# Patient Record
Sex: Female | Born: 1995 | Race: White | Hispanic: No | Marital: Single | Smoking: Current every day smoker
Health system: Southern US, Community
[De-identification: ages and names within clinical notes are randomized; demographics above are authoritative.]

## PROBLEM LIST (undated history)

## (undated) DIAGNOSIS — Z789 Other specified health status: Secondary | ICD-10-CM

## (undated) DIAGNOSIS — J40 Bronchitis, not specified as acute or chronic: Secondary | ICD-10-CM

## (undated) HISTORY — PX: NO PAST SURGERIES: SHX2092

---

## 2016-05-23 ENCOUNTER — Emergency Department (HOSPITAL_BASED_OUTPATIENT_CLINIC_OR_DEPARTMENT_OTHER)
Admission: EM | Admit: 2016-05-23 | Discharge: 2016-05-23 | Disposition: A | Discharge status: Home / Self Care | Payer: Medicaid Other | Attending: Emergency Medicine | Admitting: Emergency Medicine

## 2016-05-23 ENCOUNTER — Encounter (HOSPITAL_BASED_OUTPATIENT_CLINIC_OR_DEPARTMENT_OTHER): Payer: Self-pay | Admitting: Emergency Medicine

## 2016-05-23 DIAGNOSIS — Z202 Contact with and (suspected) exposure to infections with a predominantly sexual mode of transmission: Secondary | ICD-10-CM | POA: Diagnosis present

## 2016-05-23 DIAGNOSIS — F17200 Nicotine dependence, unspecified, uncomplicated: Secondary | ICD-10-CM | POA: Diagnosis not present

## 2016-05-23 DIAGNOSIS — R102 Pelvic and perineal pain: Secondary | ICD-10-CM | POA: Insufficient documentation

## 2016-05-23 DIAGNOSIS — N898 Other specified noninflammatory disorders of vagina: Secondary | ICD-10-CM | POA: Insufficient documentation

## 2016-05-23 HISTORY — DX: Bronchitis, not specified as acute or chronic: J40

## 2016-05-23 LAB — URINALYSIS, ROUTINE W REFLEX MICROSCOPIC
Bilirubin Urine: NEGATIVE
GLUCOSE, UA: NEGATIVE mg/dL
Hgb urine dipstick: NEGATIVE
Ketones, ur: NEGATIVE mg/dL
NITRITE: NEGATIVE
PH: 8.5 — AB (ref 5.0–8.0)
Protein, ur: NEGATIVE mg/dL
SPECIFIC GRAVITY, URINE: 1.011 (ref 1.005–1.030)

## 2016-05-23 LAB — URINALYSIS, MICROSCOPIC (REFLEX): RBC / HPF: NONE SEEN RBC/hpf (ref 0–5)

## 2016-05-23 LAB — WET PREP, GENITAL
CLUE CELLS WET PREP: NONE SEEN
SPERM: NONE SEEN
TRICH WET PREP: NONE SEEN
WBC, Wet Prep HPF POC: NONE SEEN
Yeast Wet Prep HPF POC: NONE SEEN

## 2016-05-23 LAB — PREGNANCY, URINE: Preg Test, Ur: NEGATIVE

## 2016-05-23 MED ORDER — CEFTRIAXONE SODIUM 250 MG IJ SOLR
250.0000 mg | Freq: Once | INTRAMUSCULAR | Status: DC
  Filled 2016-05-23: qty 250

## 2016-05-23 MED ORDER — AZITHROMYCIN 250 MG PO TABS
1000.0000 mg | ORAL_TABLET | Freq: Once | ORAL | Status: DC
  Filled 2016-05-23: qty 4

## 2016-05-23 MED ORDER — LIDOCAINE HCL (PF) 1 % IJ SOLN
INTRAMUSCULAR | Status: AC
  Filled 2016-05-23: qty 5

## 2016-05-23 NOTE — ED Triage Notes (Signed)
STD check

## 2016-05-23 NOTE — Discharge Instructions (Signed)
Follow up with OBGYN for further follow up for your discharge and pelvic exam

## 2016-05-23 NOTE — ED Provider Notes (Signed)
[facility]   CSN: [id number] Arrival date & time: 05/23/16  1517     History   Chief Complaint Chief Complaint  Patient presents with  . STD check    HPI Susan Decker is a 21 y.o. female.  21 yo F with a cc of concern for STD.  Also thinks she miscarried about 3 months ago.  Having brownish discharge.  Having some vaginal pain. 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    The history is provided by the patient.  Illness  This is a new problem. The current episode started 2 days ago. The problem occurs constantly. The problem has not changed since onset.Pertinent negatives include no chest pain, no headaches and no shortness of breath. Nothing aggravates the symptoms. Nothing relieves the symptoms. She has tried nothing for the symptoms. The treatment provided no relief.    Past Medical History:  Diagnosis Date  . Bronchitis     There are no active problems to display for this patient.   History reviewed. No pertinent surgical history.  OB History    No data available       Home Medications    Prior to Admission medications   Not on File    Family History History reviewed. No pertinent family history.  Social History Social History  Substance Use Topics  . Smoking status: Current Every Day Smoker  . Smokeless tobacco: Never Used  . Alcohol use Yes     Allergies   Patient has no known allergies.   Review of Systems Review of Systems  Constitutional: Negative for chills and fever.  HENT: Negative for congestion and rhinorrhea.   Eyes: Negative for redness and visual disturbance.  Respiratory: Negative for shortness of breath and wheezing.   Cardiovascular: Negative for chest pain and palpitations.  Gastrointestinal: Negative for nausea and vomiting.  Genitourinary: Positive for vaginal pain. Negative for dysuria and urgency.  Musculoskeletal: Negative for arthralgias and myalgias.  Skin: Negative for  pallor and wound.  Neurological: Negative for dizziness and headaches.     Physical Exam Updated Vital Signs BP 113/78 (BP Location: Left Arm)   Pulse 74   Temp 98.2 F (36.8 C) (Oral)   Resp 17   Ht  (1.626 m)   Wt 158 lb (71.7 kg)   LMP 04/28/2016   SpO2 100%   BMI 27.12 kg/m   Physical Exam  Constitutional: She is oriented to person, place, and time. She appears well-developed and well-nourished. No distress.  HENT:  Head: Normocephalic and atraumatic.  Eyes: EOM are normal. Pupils are equal, round, and reactive to light.  Neck: Normal range of motion. Neck supple.  Cardiovascular: Normal rate and regular rhythm.  Exam reveals no gallop and no friction rub.   No murmur heard. Pulmonary/Chest: Effort normal. She has no wheezes. She has no rales.  Abdominal: Soft. She exhibits no distension and no mass. There is no tenderness. There is no guarding.  Genitourinary:  Genitourinary Comments: Unable to fully visualize due to patient comfort.  Not tolerating speculum or digital exam.    Musculoskeletal: She exhibits no edema or tenderness.  Neurological: She is alert and oriented to person, place, and time.  Skin: Skin is warm and dry. She is not diaphoretic.  Psychiatric: She has a normal mood and affect. Her behavior is normal.  Nursing note and vitals reviewed.    ED Treatments / Results  Labs (all labs ordered are listed, but only  abnormal results are displayed) Labs Reviewed  URINALYSIS, ROUTINE W REFLEX MICROSCOPIC - Abnormal; Notable for the following:       Result Value   pH 8.5 (*)    Leukocytes, UA SMALL (*)    All other components within normal limits  URINALYSIS, MICROSCOPIC (REFLEX) - Abnormal; Notable for the following:    Bacteria, UA RARE (*)    Squamous Epithelial / LPF 0-5 (*)    All other components within normal limits  WET PREP, GENITAL  PREGNANCY, URINE  RPR  HIV ANTIBODY (ROUTINE TESTING)  GC/CHLAMYDIA PROBE AMP ([place]) NOT AT [facility]     EKG  EKG Interpretation None       Radiology No results found.  Procedures Procedures (including critical care time)  Medications Ordered in ED Medications  cefTRIAXone (ROCEPHIN) injection 250 mg (250 mg Intramuscular Refused 05/23/16 1632)  azithromycin (ZITHROMAX) tablet 1,000 mg (1,000 mg Oral Refused 05/23/16 1632)  lidocaine (PF) (XYLOCAINE) 1 % injection (  Refused 05/23/16 1632)     Initial Impression / Assessment and Plan / ED Course  I have reviewed the triage vital signs and the nursing notes.  Pertinent labs & imaging results that were available during my care of the patient were reviewed by me and considered in my medical decision making (see chart for details).     21 yo F with a cc of concern for STD.  Unable to perform exam as patient newly had sex for the first time.  Blind swabs sent, will cover for 2 most common STDs.  Check upreg.   Labs unremarkable. Will the patient follow-up with OB/GYN for further evaluation.  5:20 PM:  I have discussed the diagnosis/risks/treatment options with the patient and believe the pt to be eligible for discharge home to follow-up with PCP. We also discussed returning to the ED immediately if new or worsening sx occur. We discussed the sx which are most concerning (e.g., sudden worsening pain, fever, inability to tolerate by mouth) that necessitate immediate return. Medications administered to the patient during their visit and any new prescriptions provided to the patient are listed below.  Medications given during this visit Medications  cefTRIAXone (ROCEPHIN) injection 250 mg (250 mg Intramuscular Refused 05/23/16 1632)  azithromycin (ZITHROMAX) tablet 1,000 mg (1,000 mg Oral Refused 05/23/16 1632)  lidocaine (PF) (XYLOCAINE) 1 % injection (  Refused 05/23/16 1632)     The patient appears reasonably screen and/or stabilized for discharge and I doubt any other medical condition or other [facility] requiring further screening,  evaluation, or treatment in the ED at this time prior to discharge.    Final Clinical Impressions(s) / ED Diagnoses   Final diagnoses:  Vaginal discharge    New Prescriptions New Prescriptions   No medications on file     Melene Plan, DO 05/23/16 1720

## 2016-05-23 NOTE — ED Triage Notes (Signed)
Pt also states she needs an [place], had a miscarriage and didn't see dr, urgent care told her to come and get [place]

## 2016-05-23 NOTE — ED Notes (Signed)
Pt given sprite for PO challenge

## 2016-05-23 NOTE — ED Notes (Signed)
Pt drank sprite and reports no nausea at this time.

## 2016-05-24 LAB — HIV ANTIBODY (ROUTINE TESTING W REFLEX): HIV SCREEN 4TH GENERATION: NONREACTIVE

## 2016-05-24 LAB — GC/CHLAMYDIA PROBE AMP (~~LOC~~) NOT AT ARMC
Chlamydia: NEGATIVE
Neisseria Gonorrhea: NEGATIVE

## 2016-05-24 LAB — RPR: RPR: NONREACTIVE

## 2016-05-25 ENCOUNTER — Telehealth (HOSPITAL_BASED_OUTPATIENT_CLINIC_OR_DEPARTMENT_OTHER): Payer: Self-pay | Admitting: *Deleted

## 2016-05-25 NOTE — Telephone Encounter (Signed)
Pt here with questions regarding lab testing from ED visit several days ago. Pt directed to Mychart and given # for Susan Decker [phone]) if she is unable to view them

## 2019-11-17 ENCOUNTER — Emergency Department (HOSPITAL_BASED_OUTPATIENT_CLINIC_OR_DEPARTMENT_OTHER)
Admission: EM | Admit: 2019-11-17 | Discharge: 2019-11-17 | Disposition: A | Discharge status: Home / Self Care | Payer: Self-pay | Attending: Emergency Medicine | Admitting: Emergency Medicine

## 2019-11-17 ENCOUNTER — Encounter (HOSPITAL_BASED_OUTPATIENT_CLINIC_OR_DEPARTMENT_OTHER): Payer: Self-pay | Admitting: *Deleted

## 2019-11-17 ENCOUNTER — Other Ambulatory Visit: Payer: Self-pay

## 2019-11-17 DIAGNOSIS — R109 Unspecified abdominal pain: Secondary | ICD-10-CM | POA: Insufficient documentation

## 2019-11-17 DIAGNOSIS — Z3A01 Less than 8 weeks gestation of pregnancy: Secondary | ICD-10-CM | POA: Insufficient documentation

## 2019-11-17 DIAGNOSIS — Z5321 Procedure and treatment not carried out due to patient leaving prior to being seen by health care provider: Secondary | ICD-10-CM | POA: Insufficient documentation

## 2019-11-17 DIAGNOSIS — O26851 Spotting complicating pregnancy, first trimester: Secondary | ICD-10-CM | POA: Insufficient documentation

## 2019-11-17 DIAGNOSIS — O26891 Other specified pregnancy related conditions, first trimester: Secondary | ICD-10-CM | POA: Insufficient documentation

## 2019-11-17 DIAGNOSIS — N939 Abnormal uterine and vaginal bleeding, unspecified: Secondary | ICD-10-CM | POA: Insufficient documentation

## 2019-11-17 LAB — URINALYSIS, ROUTINE W REFLEX MICROSCOPIC

## 2019-11-17 LAB — HCG, QUANTITATIVE, PREGNANCY: hCG, Beta Chain, Quant, S: 7 m[IU]/mL — ABNORMAL HIGH (ref <5)

## 2019-11-17 LAB — URINALYSIS, MICROSCOPIC (REFLEX): RBC / HPF: >50 RBC/hpf (ref 0–5)

## 2019-11-17 LAB — PREGNANCY, URINE: Preg Test, Ur: NEGATIVE

## 2019-11-17 MED ORDER — ACETAMINOPHEN 500 MG PO TABS
1000.0000 mg | ORAL_TABLET | Freq: Once | ORAL | Status: AC
  Administered 2019-11-17: 1000 mg via ORAL
  Filled 2019-11-17: qty 2

## 2019-11-17 NOTE — ED Triage Notes (Signed)
Vaginal bleeding and abdominal cramps this afternoon. She had a positive home pregnancy test. She is [redacted] weeks pregnant.

## 2019-12-07 ENCOUNTER — Ambulatory Visit (HOSPITAL_COMMUNITY): Admission: EM | Admit: 2019-12-07 | Discharge: 2019-12-07 | Discharge status: Against Medical Advice | Payer: Self-pay

## 2019-12-07 ENCOUNTER — Other Ambulatory Visit: Payer: Self-pay

## 2019-12-07 NOTE — ED Notes (Signed)
Patient called for registration with no answer

## 2019-12-16 ENCOUNTER — Ambulatory Visit
Admission: EM | Admit: 2019-12-16 | Discharge: 2019-12-16 | Disposition: A | Discharge status: Home / Self Care | Payer: 59 | Attending: Family Medicine | Admitting: Family Medicine

## 2019-12-16 ENCOUNTER — Encounter: Payer: Self-pay | Admitting: Emergency Medicine

## 2019-12-16 ENCOUNTER — Other Ambulatory Visit: Payer: Self-pay

## 2019-12-16 DIAGNOSIS — H7292 Unspecified perforation of tympanic membrane, left ear: Secondary | ICD-10-CM

## 2019-12-16 NOTE — ED Provider Notes (Signed)
[name]    CSN: [id number] Arrival date & time: 12/16/19  1015      History   Chief Complaint Chief Complaint  Patient presents with   Ear Fullness    HPI Susan Decker is a 24 y.o. female.   Patient complains of some left ear symptoms.  She first describes it as fullness and feeling like her ear is underwater.  Has some difficulty hearing as well as ear pain.  She tells me she suffered a blow to the side of the head and ear about 3 days ago and symptoms started then.  She denies any congestion or sore throat prior to the onset.  HPI  Past Medical History:  Diagnosis Date   Bronchitis     There are no problems to display for this patient.   History reviewed. No pertinent surgical history.  OB History    Gravida  1   Para      Term      Preterm      AB      Living        SAB      TAB      Ectopic      Multiple      Live Births               Home Medications    Prior to Admission medications   Not on File    Family History History reviewed. No pertinent family history.  Social History Social History   Tobacco Use   Smoking status: Former Smoker   Smokeless tobacco: Never Used  Substance Use Topics   Alcohol use: Yes   Drug use: Not Currently     Allergies   Patient has no known allergies.   Review of Systems Review of Systems  HENT: Positive for ear pain and hearing loss.   All other systems reviewed and are negative.    Physical Exam Triage Vital Signs ED Triage Vitals  Enc Vitals Group     BP 12/16/19 1028 118/75     Pulse Rate 12/16/19 1028 75     Resp 12/16/19 1028 12     Temp 12/16/19 1028 99.3 F (37.4 C)     Temp Source 12/16/19 1028 Oral     SpO2 12/16/19 1028 97 %     Weight --      Height --      Head Circumference --      Peak Flow --      Pain Score 12/16/19 1026 0     Pain Loc --      Pain Edu? --      Excl. in GC? --    No data found.  Updated Vital Signs BP 118/75  (BP Location: Right Arm)    Pulse 75    Temp 99.3 F (37.4 C) (Oral)    Resp 12    LMP 12/09/2019    SpO2 97%    Breastfeeding Unknown   Visual Acuity Right Eye Distance:   Left Eye Distance:   Bilateral Distance:    Right Eye Near:   Left Eye Near:    Bilateral Near:     Physical Exam Vitals and nursing note reviewed.  Constitutional:      Appearance: Normal appearance. She is normal weight.  HENT:     Head: Normocephalic.     Ears:     Comments: Left TM has a perforation with some bones in the middle ear  visible through the perforation. Cardiovascular:     Rate and Rhythm: Regular rhythm.  Pulmonary:     Effort: Pulmonary effort is normal.     Breath sounds: Normal breath sounds.  Neurological:     General: No focal deficit present.     Mental Status: She is alert and oriented to person, place, and time.      UC Treatments / Results  Labs (all labs ordered are listed, but only abnormal results are displayed) Labs Reviewed - No data to display  EKG   Radiology No results found.  Procedures Procedures (including critical care time)  Medications Ordered in UC Medications - No data to display  Initial Impression / Assessment and Plan / UC Course  I have reviewed the triage vital signs and the nursing notes.  Pertinent labs & imaging results that were available during my care of the patient were reviewed by me and considered in my medical decision making (see chart for details).     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  Will refer to ENT for possible repair Final Clinical Impressions(s) / UC Diagnoses   Final diagnoses:  None   Discharge Instructions   None    ED Prescriptions    None     PDMP not reviewed this encounter.   Susan Kuster, MD 12/16/19 1054

## 2019-12-16 NOTE — ED Triage Notes (Signed)
Patient c/o LFT ear numbness x 4 days.   Patient stated "it feels like my ear is under water and I'm having some difficulty hearing, this started with some ear pain".   Patient endorses she may have hit it but can't remember.   Patient denies drainage from ear.   Patient has tried olive oil w/ no relief of symptoms.   Patient denies history of ear infection or pain.

## 2019-12-16 NOTE — Discharge Instructions (Addendum)
Please call office of ear nose and throat specialist for appointment for surgical consultation([facility])

## 2019-12-28 ENCOUNTER — Ambulatory Visit: Payer: Self-pay | Admitting: Nurse Practitioner

## 2020-01-03 ENCOUNTER — Encounter (HOSPITAL_COMMUNITY): Payer: Self-pay | Admitting: *Deleted

## 2020-01-03 ENCOUNTER — Telehealth (HOSPITAL_COMMUNITY): Payer: Self-pay | Admitting: *Deleted

## 2020-01-03 ENCOUNTER — Other Ambulatory Visit: Payer: Self-pay

## 2020-01-03 ENCOUNTER — Ambulatory Visit (HOSPITAL_COMMUNITY): Admission: EM | Admit: 2020-01-03 | Discharge: 2020-01-03 | Discharge status: Against Medical Advice | Payer: 59

## 2020-01-03 DIAGNOSIS — R109 Unspecified abdominal pain: Secondary | ICD-10-CM | POA: Diagnosis not present

## 2020-01-03 LAB — POC URINE PREG, ED: Preg Test, Ur: NEGATIVE

## 2020-01-03 LAB — POCT URINALYSIS DIPSTICK, ED / UC
Bilirubin Urine: NEGATIVE
Glucose, UA: NEGATIVE mg/dL
Ketones, ur: NEGATIVE mg/dL
Leukocytes,Ua: NEGATIVE
Nitrite: NEGATIVE
Protein, ur: NEGATIVE mg/dL
Specific Gravity, Urine: 1.025 (ref 1.005–1.030)
Urobilinogen, UA: 0.2 mg/dL (ref 0.0–1.0)
pH: 6 (ref 5.0–8.0)

## 2020-01-03 NOTE — ED Notes (Signed)
Provider went into exam room; pt was missing.  Not present in waiting area or restrooms.  T/C to pt - pt states she left because she was in so much pain, and went home and took an IBU.  Provider notified that pt eloped.

## 2020-01-03 NOTE — ED Triage Notes (Signed)
C/O a "clumping, like a lump" to right of umbilicus x 3 days "that moves"; states when she stand up "it feels like it's pulling".  No bulging noted while sitting in triage, but area very tender to palpation.  Denies any fevers.  Denies vomiting or diarrhea; has had some nausea at times.

## 2020-10-31 ENCOUNTER — Other Ambulatory Visit: Payer: Self-pay

## 2020-10-31 ENCOUNTER — Ambulatory Visit (HOSPITAL_COMMUNITY)
Admission: EM | Admit: 2020-10-31 | Discharge: 2020-10-31 | Disposition: A | Discharge status: Home / Self Care | Payer: 59

## 2021-02-24 ENCOUNTER — Other Ambulatory Visit: Payer: Self-pay

## 2021-02-24 ENCOUNTER — Inpatient Hospital Stay (HOSPITAL_COMMUNITY): Payer: Medicaid Other

## 2021-02-24 ENCOUNTER — Inpatient Hospital Stay (HOSPITAL_COMMUNITY)
Admission: AD | Admit: 2021-02-24 | Discharge: 2021-02-24 | Disposition: A | Discharge status: Home / Self Care | Payer: Medicaid Other | Attending: Obstetrics and Gynecology | Admitting: Obstetrics and Gynecology

## 2021-02-24 ENCOUNTER — Encounter (HOSPITAL_COMMUNITY): Payer: Self-pay | Admitting: Obstetrics and Gynecology

## 2021-02-24 DIAGNOSIS — O26891 Other specified pregnancy related conditions, first trimester: Secondary | ICD-10-CM | POA: Insufficient documentation

## 2021-02-24 DIAGNOSIS — O209 Hemorrhage in early pregnancy, unspecified: Secondary | ICD-10-CM | POA: Diagnosis not present

## 2021-02-24 DIAGNOSIS — R1030 Lower abdominal pain, unspecified: Secondary | ICD-10-CM | POA: Diagnosis not present

## 2021-02-24 DIAGNOSIS — Z3A01 Less than 8 weeks gestation of pregnancy: Secondary | ICD-10-CM | POA: Diagnosis not present

## 2021-02-24 LAB — HCG, QUANTITATIVE, PREGNANCY: hCG, Beta Chain, Quant, S: 51521 m[IU]/mL — ABNORMAL HIGH (ref <5)

## 2021-02-24 LAB — CBC WITH DIFFERENTIAL/PLATELET
Abs Immature Granulocytes: 0.03 10*3/uL (ref 0.00–0.07)
Basophils Absolute: 0 10*3/uL (ref 0.0–0.1)
Basophils Relative: 1 %
Eosinophils Absolute: 0 10*3/uL (ref 0.0–0.5)
Eosinophils Relative: 1 %
HCT: 40 % (ref 36.0–46.0)
Hemoglobin: 13.8 g/dL (ref 12.0–15.0)
Immature Granulocytes: 0 %
Lymphocytes Relative: 28 %
Lymphs Abs: 2.5 10*3/uL (ref 0.7–4.0)
MCH: 28.7 pg (ref 26.0–34.0)
MCHC: 34.5 g/dL (ref 30.0–36.0)
MCV: 83.2 fL (ref 80.0–100.0)
Monocytes Absolute: 0.6 10*3/uL (ref 0.1–1.0)
Monocytes Relative: 7 %
Neutro Abs: 5.7 10*3/uL (ref 1.7–7.7)
Neutrophils Relative %: 63 %
Platelets: 337 10*3/uL (ref 150–400)
RBC: 4.81 MIL/uL (ref 3.87–5.11)
RDW: 12.4 % (ref 11.5–15.5)
WBC: 8.9 10*3/uL (ref 4.0–10.5)
nRBC: 0 % (ref 0.0–0.2)

## 2021-02-24 LAB — URINALYSIS, ROUTINE W REFLEX MICROSCOPIC
Bilirubin Urine: NEGATIVE
Glucose, UA: NEGATIVE mg/dL
Ketones, ur: NEGATIVE mg/dL
Leukocytes,Ua: NEGATIVE
Nitrite: NEGATIVE
Protein, ur: NEGATIVE mg/dL
Specific Gravity, Urine: 1.016 (ref 1.005–1.030)
pH: 7 (ref 5.0–8.0)

## 2021-02-24 LAB — POCT PREGNANCY, URINE: Preg Test, Ur: POSITIVE — AB

## 2021-02-24 LAB — WET PREP, GENITAL
Clue Cells Wet Prep HPF POC: NONE SEEN
Sperm: NONE SEEN
Trich, Wet Prep: NONE SEEN
WBC, Wet Prep HPF POC: <10 (ref <10)
Yeast Wet Prep HPF POC: NONE SEEN

## 2021-02-24 LAB — ABO/RH: ABO/RH(D): O POS

## 2021-02-24 MED ORDER — ACETAMINOPHEN 500 MG PO TABS
1000.0000 mg | ORAL_TABLET | Freq: Once | ORAL | Status: DC
  Filled 2021-02-24: qty 2

## 2021-02-24 NOTE — Discharge Instructions (Signed)
Return to MAU: If you have heavier bleeding that soaks through more than 2 pads per hour for an hour or more If you bleed so much that you feel like you might pass out or you do pass out If you have significant abdominal pain that is not improved with Tylenol 1000 mg every 6 hours as needed for pain If you develop a fever > 100.4  

## 2021-02-24 NOTE — MAU Provider Note (Signed)
History     CSN: [id number]  Arrival date and time: 02/24/21 1416  Chief Complaint  Patient presents with   Abdominal Pain   Vaginal Bleeding   Susan Decker is a 26 yo female at [redacted]w[redacted]d by LMP presenting for evaluation of abdominal cramping and vaginal bleeding.   Her cramping and bleeding started today around 12 pm. She noticed quarter sized pink colored tissue when she wiped after using the restroom followed by lower uterine cramping. Prior to onset, she otherwise felt at baseline. Denies any fever, vaginal discharge, or dysuria.   She reports she had an ultrasound at [facility] last week and was told there was "a sac." This is a desired pregnancy. 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   Past Medical History:  Diagnosis Date   Bronchitis     No past surgical history on file.  Family History  Problem Relation Age of Onset   Diabetes Mother    Healthy Father     Social History   Tobacco Use   Smoking status: Every Day   Smokeless tobacco: Never  Vaping Use   Vaping Use: Never used  Substance Use Topics   Alcohol use: Not Currently   Drug use: Not Currently    Types: Marijuana    Allergies: No Known Allergies  Medications Prior to Admission  Medication Sig Dispense Refill Last Dose   ibuprofen (ADVIL) 800 MG tablet Take 200 mg by mouth every 8 (eight) hours as needed.       Review of Systems  Constitutional:  Negative for chills, fatigue and fever.  Respiratory:  Negative for chest tightness and shortness of breath.   Cardiovascular:  Negative for chest pain and leg swelling.  Gastrointestinal:  Positive for abdominal pain. Negative for constipation, diarrhea, nausea and vomiting.  Genitourinary:  Positive for pelvic pain, vaginal bleeding and vaginal pain. Negative for decreased urine volume, difficulty urinating, dysuria, flank pain, urgency and vaginal discharge.  Skin:  Negative for rash.  Neurological:  Negative for dizziness, weakness, light-headedness  and headaches.  Psychiatric/Behavioral:  Negative for behavioral problems.   Physical Exam   Blood pressure 117/67, pulse 66, temperature 97.9 F (36.6 C), temperature source Oral, resp. rate 18, height 5\' 4"  (1.626 m), weight 81.6 kg, last menstrual period 01/07/2021, SpO2 100 %.  Physical Exam Constitutional:      Appearance: She is well-developed. She is not ill-appearing.     Comments: Appears uncomfortable   HENT:     Head: Normocephalic and atraumatic.     Mouth/Throat:     Mouth: Mucous membranes are moist.  Eyes:     Extraocular Movements: Extraocular movements intact.  Cardiovascular:     Rate and Rhythm: Normal rate.  Pulmonary:     Effort: Pulmonary effort is normal.  Abdominal:     Tenderness: There is no right CVA tenderness or left CVA tenderness. Negative signs include Rovsing's sign.     Comments: Soft, non-distended. Tender to suprapubic region without rebounding or guarding. No McBurney point tenderness.   Genitourinary:    Comments: Attempted pelvic exam with speculum. However on entrance of speculum into the introitus patient reported the area felt too sore and discontinued the exam. Noted small amount of white vaginal discharge without bleeding seen at vaginal opening. Patient self collected swabs.  Skin:    General: Skin is warm and dry.     Capillary Refill: Capillary refill takes less than 2 seconds.  Neurological:     Mental Status: She  is alert and oriented to person, place, and time.  Psychiatric:        Behavior: Behavior normal.    MAU Course   MDM UA, UPT: UA unremarkable  CBC, HCG: No anemia present  ABO/Rh: O positive, no RhoGAM needed  Wet prep and gc/chlamydia: self collected  US OB Comp Less 14 weeks with Transvaginal  Ordered tylenol, but patient declined as her pain was better.  Pelvic exam attempted but not tolerated by patient.   Reassessed after ultrasound: Discussed results of possible early vs progression to failed pregnancy. [place]  with yolk sac and "possible" embryo without cardiac activity able to be seen. Pain is currently minimal. No bleeding since arrival to MAU.   Assessment and Plan   1. Vaginal bleeding affecting early pregnancy Yolk sac present with possible embryo. Quant [id number]. This pregnancy is very desired. Scheduled [facility] follow up in the MAU on Sunday 1/29 to assess trend. Tylenol PRN at home.   MAU precautions discussed. Discharged home in stable condition.     Patriciaann Clan 02/24/2021, 3:30 PM

## 2021-02-24 NOTE — MAU Note (Signed)
Susan Decker is a 26 y.o. at [redacted]w[redacted]d here in MAU reporting: today started having abdominal pain and vaginal bleeding. States she is spotting and seeing pink tissue.   LMP: 01/07/21  Onset of complaint: today  Pain score: 9/10  Vitals:   02/24/21 1524  BP: 117/67  Pulse: 66  Resp: 18  Temp: 97.9 F (36.6 C)  SpO2: 100%     Lab orders placed from triage: upt, ua

## 2021-02-26 ENCOUNTER — Other Ambulatory Visit (HOSPITAL_COMMUNITY): Admit: 2021-02-26 | Payer: Self-pay

## 2021-02-26 ENCOUNTER — Encounter (HOSPITAL_COMMUNITY): Payer: Self-pay

## 2021-02-26 ENCOUNTER — Inpatient Hospital Stay (HOSPITAL_COMMUNITY)
Admission: AD | Admit: 2021-02-26 | Discharge: 2021-02-27 | Disposition: A | Discharge status: Home / Self Care | Payer: Medicaid Other | Attending: Obstetrics & Gynecology | Admitting: Obstetrics & Gynecology

## 2021-02-26 ENCOUNTER — Other Ambulatory Visit: Payer: Self-pay

## 2021-02-26 DIAGNOSIS — K209 Esophagitis, unspecified without bleeding: Secondary | ICD-10-CM | POA: Insufficient documentation

## 2021-02-26 DIAGNOSIS — O219 Vomiting of pregnancy, unspecified: Secondary | ICD-10-CM | POA: Insufficient documentation

## 2021-02-26 DIAGNOSIS — O26891 Other specified pregnancy related conditions, first trimester: Secondary | ICD-10-CM | POA: Insufficient documentation

## 2021-02-26 DIAGNOSIS — R42 Dizziness and giddiness: Secondary | ICD-10-CM | POA: Insufficient documentation

## 2021-02-26 DIAGNOSIS — E860 Dehydration: Secondary | ICD-10-CM | POA: Insufficient documentation

## 2021-02-26 DIAGNOSIS — O99611 Diseases of the digestive system complicating pregnancy, first trimester: Secondary | ICD-10-CM | POA: Insufficient documentation

## 2021-02-26 DIAGNOSIS — Z3A01 Less than 8 weeks gestation of pregnancy: Secondary | ICD-10-CM | POA: Insufficient documentation

## 2021-02-26 DIAGNOSIS — O99281 Endocrine, nutritional and metabolic diseases complicating pregnancy, first trimester: Secondary | ICD-10-CM | POA: Insufficient documentation

## 2021-02-26 HISTORY — DX: Other specified health status: Z78.9

## 2021-02-26 MED ORDER — ALUM & MAG HYDROXIDE-SIMETH 200-200-20 MG/5ML PO SUSP
30.0000 mL | Freq: Once | ORAL | Status: DC
  Administered 2021-02-27: 30 mL via ORAL
  Filled 2021-02-26: qty 30

## 2021-02-26 MED ORDER — PROMETHAZINE HCL 25 MG PO TABS
25.0000 mg | ORAL_TABLET | Freq: Once | ORAL | Status: DC
  Administered 2021-02-27: 25 mg via ORAL
  Filled 2021-02-26: qty 1

## 2021-02-26 NOTE — MAU Note (Signed)
Susan Decker is a 26 y.o. at [redacted]w[redacted]d here in MAU reporting: pt reports last night she started having severe nausea and vomiting. At 2100 she noticed blood in her emesis-states now she can not swallow and is unable to tolerate swallowing anything. She stated that she feels light headed and dizzy she feels from not being able to eat or drink.   Vitals:   02/26/21 2213 02/26/21 2346  BP: 124/66 122/74  Pulse: 75 74  Resp: 16 17  Temp: 98 F (36.7 C) 98 F (36.7 C)  SpO2: 99% 100%

## 2021-02-26 NOTE — ED Notes (Signed)
Spoke to APP in MAU and MAU charge nurse, pt being sent to MAU.

## 2021-02-26 NOTE — ED Triage Notes (Addendum)
Pt presents to the ED from home with complaints of vomiting blood. Pt states she was vomiting and then it turned bloody. Pt not actively vomiting in triage. Pt complaining of pain in throat. Pt states she had 2 episodes of vaginal spotting today prior to coming in.

## 2021-02-27 ENCOUNTER — Encounter (HOSPITAL_COMMUNITY): Payer: Self-pay | Admitting: Obstetrics & Gynecology

## 2021-02-27 DIAGNOSIS — O219 Vomiting of pregnancy, unspecified: Secondary | ICD-10-CM | POA: Diagnosis not present

## 2021-02-27 DIAGNOSIS — R42 Dizziness and giddiness: Secondary | ICD-10-CM | POA: Diagnosis not present

## 2021-02-27 DIAGNOSIS — E860 Dehydration: Secondary | ICD-10-CM

## 2021-02-27 DIAGNOSIS — Z3A01 Less than 8 weeks gestation of pregnancy: Secondary | ICD-10-CM | POA: Diagnosis not present

## 2021-02-27 DIAGNOSIS — K209 Esophagitis, unspecified without bleeding: Secondary | ICD-10-CM

## 2021-02-27 DIAGNOSIS — O26891 Other specified pregnancy related conditions, first trimester: Secondary | ICD-10-CM | POA: Diagnosis not present

## 2021-02-27 DIAGNOSIS — O99611 Diseases of the digestive system complicating pregnancy, first trimester: Secondary | ICD-10-CM | POA: Diagnosis not present

## 2021-02-27 DIAGNOSIS — O99281 Endocrine, nutritional and metabolic diseases complicating pregnancy, first trimester: Secondary | ICD-10-CM | POA: Diagnosis not present

## 2021-02-27 LAB — GC/CHLAMYDIA PROBE AMP (~~LOC~~) NOT AT ARMC
Chlamydia: NEGATIVE
Comment: NEGATIVE
Comment: NORMAL
Neisseria Gonorrhea: NEGATIVE

## 2021-02-27 LAB — CBC
HCT: 40.7 % (ref 36.0–46.0)
Hemoglobin: 13.6 g/dL (ref 12.0–15.0)
MCH: 28.3 pg (ref 26.0–34.0)
MCHC: 33.4 g/dL (ref 30.0–36.0)
MCV: 84.6 fL (ref 80.0–100.0)
Platelets: 359 10*3/uL (ref 150–400)
RBC: 4.81 MIL/uL (ref 3.87–5.11)
RDW: 12.3 % (ref 11.5–15.5)
WBC: 12.7 10*3/uL — ABNORMAL HIGH (ref 4.0–10.5)
nRBC: 0 % (ref 0.0–0.2)

## 2021-02-27 LAB — COMPREHENSIVE METABOLIC PANEL
ALT: 24 U/L (ref 0–44)
AST: 22 U/L (ref 15–41)
Albumin: 4 g/dL (ref 3.5–5.0)
Alkaline Phosphatase: 59 U/L (ref 38–126)
Anion gap: 10 (ref 5–15)
BUN: 11 mg/dL (ref 6–20)
CO2: 22 mmol/L (ref 22–32)
Calcium: 9.3 mg/dL (ref 8.9–10.3)
Chloride: 105 mmol/L (ref 98–111)
Creatinine, Ser: 0.71 mg/dL (ref 0.44–1.00)
GFR, Estimated: >60 mL/min (ref >60)
Glucose, Bld: 110 mg/dL — ABNORMAL HIGH (ref 70–99)
Potassium: 3.5 mmol/L (ref 3.5–5.1)
Sodium: 137 mmol/L (ref 135–145)
Total Bilirubin: 0.5 mg/dL (ref 0.3–1.2)
Total Protein: 7.5 g/dL (ref 6.5–8.1)

## 2021-02-27 LAB — LIPASE, BLOOD: Lipase: 27 U/L (ref 11–51)

## 2021-02-27 LAB — AMYLASE: Amylase: 41 U/L (ref 28–100)

## 2021-02-27 MED ORDER — FAMOTIDINE IN NACL 20-0.9 MG/50ML-% IV SOLN
20.0000 mg | Freq: Once | INTRAVENOUS | Status: AC
Start: 2021-02-27 — End: 2021-02-27
  Administered 2021-02-27: 20 mg via INTRAVENOUS
  Filled 2021-02-27: qty 50

## 2021-02-27 MED ORDER — ONDANSETRON 4 MG PO TBDP: 4.0000 mg | ORAL_TABLET | Freq: Four times a day (QID) | ORAL | 0 refills | Status: DC | PRN

## 2021-02-27 MED ORDER — LIDOCAINE VISCOUS HCL 2 % MT SOLN
15.0000 mL | Freq: Once | OROMUCOSAL | Status: DC
  Filled 2021-02-27: qty 15

## 2021-02-27 MED ORDER — PROMETHAZINE HCL 25 MG PO TABS: 25.0000 mg | ORAL_TABLET | Freq: Four times a day (QID) | ORAL | 2 refills | Status: DC | PRN

## 2021-02-27 MED ORDER — LACTATED RINGERS IV SOLN: Freq: Once | INTRAVENOUS | Status: AC

## 2021-02-27 MED ORDER — FAMOTIDINE 20 MG PO TABS
20.0000 mg | ORAL_TABLET | Freq: Two times a day (BID) | ORAL | 0 refills | Status: DC
Start: 2021-02-27 — End: 2021-10-31

## 2021-02-27 MED ORDER — SODIUM CHLORIDE 0.9 % IV SOLN
25.0000 mg | Freq: Once | INTRAVENOUS | Status: AC
  Administered 2021-02-27: 25 mg via INTRAVENOUS
  Filled 2021-02-27: qty 1

## 2021-02-27 MED ORDER — ALUM & MAG HYDROXIDE-SIMETH 200-200-20 MG/5ML PO SUSP: 30.0000 mL | Freq: Once | ORAL | Status: AC

## 2021-02-27 NOTE — MAU Provider Note (Signed)
Chief Complaint: Hematemesis and Dizziness   Event Date/Time   First Provider Initiated Contact with Patient 02/27/21 0018        SUBJECTIVE HPI: Susan Decker is a 26 y.o. G3P0020 at [redacted]w[redacted]d by LMP who presents to maternity admissions reporting frequent vomiting today with development of blood in her vomit and severe discomfort in her throat from the stomach acid. Was seen here 02/24/21 for spotting and IUP with Yolk Sac was seen. . She denies h/a, dizziness, or fever/chills.   Has not taken anything for the nausea.  STates was waiting for her insurance to start prenatal care.   Dizziness This is a new problem. The current episode started today. The problem has been waxing and waning. Associated symptoms include nausea, a sore throat and vomiting. Pertinent negatives include no abdominal pain, chills, fever, myalgias or vertigo. Nothing aggravates the symptoms. She has tried nothing for the symptoms.  Emesis  This is a recurrent problem. Episode onset: Has had some nausea and vomiting but today it got much worse. There has been no fever. Associated symptoms include dizziness. Pertinent negatives include no abdominal pain, chills, fever, myalgias or URI. She has tried nothing for the symptoms.   RN Note: [name] is a 26 y.o. at [redacted]w[redacted]d here in MAU reporting: pt reports last night she started having severe nausea and vomiting. At 2100 she noticed blood in her emesis-states now she can not swallow and is unable to tolerate swallowing anything. She stated that she feels light headed and dizzy she feels from not being able to eat or drink.  Past Medical History:  Diagnosis Date   Bronchitis    Medical history non-contributory    Past Surgical History:  Procedure Laterality Date   NO PAST SURGERIES     Social History   Socioeconomic History   Marital status: Married    Spouse name: Not on file   Number of children: Not on file   Years of education: Not on file   Highest education level:  Not on file  Occupational History   Not on file  Tobacco Use   Smoking status: Every Day   Smokeless tobacco: Never  Vaping Use   Vaping Use: Never used  Substance and Sexual Activity   Alcohol use: Not Currently   Drug use: Not Currently    Types: Marijuana   Sexual activity: Yes    Birth control/protection: None  Other Topics Concern   Not on file  Social History Narrative   Not on file   Social Determinants of Health   Financial Resource Strain: Not on file  Food Insecurity: Not on file  Transportation Needs: Not on file  Physical Activity: Not on file  Stress: Not on file  Social Connections: Not on file  Intimate Partner Violence: Not on file   No current facility-administered medications on file prior to encounter.   No current outpatient medications on file prior to encounter.   Allergies  Allergen Reactions   Latex Itching    I have reviewed patient's Past Medical Hx, Surgical Hx, Family Hx, Social Hx, medications and allergies.   ROS:  Review of Systems  Constitutional:  Negative for chills and fever.  HENT:  Positive for sore throat.   Gastrointestinal:  Positive for nausea and vomiting. Negative for abdominal pain.  Musculoskeletal:  Negative for myalgias.  Neurological:  Positive for dizziness. Negative for vertigo.  Review of Systems  Other systems negative   Physical Exam  Physical Exam Patient Vitals  for the past 24 hrs:  BP Temp Temp src Pulse Resp SpO2 Height Weight  02/26/21 2346 122/74 98 F (36.7 C) Oral 74 17 100 % 5\' 4"  (1.626 m) 80.6 kg  02/26/21 2239 -- -- -- -- -- -- 5\' 4"  (1.626 m) 82 kg  02/26/21 2213 124/66 98 F (36.7 C) Oral 75 16 99 % -- --   Constitutional: Well-developed, well-nourished female in no acute distress.  Cardiovascular: normal rate Respiratory: normal effort GI: Abd soft, non-tender.  MS: Extremities nontender, no edema, normal ROM Neurologic: Alert and oriented x 4.  GU: Neg CVAT.  LAB RESULTS Results  for orders placed or performed during the hospital encounter of 02/26/21 (from the past 24 hour(s))  CBC     Status: Abnormal   Collection Time: 02/27/21 12:58 AM  Result Value Ref Range   WBC 12.7 (H) 4.0 - 10.5 K/uL   RBC 4.81 3.87 - 5.11 MIL/uL   Hemoglobin 13.6 12.0 - 15.0 g/dL   HCT 02/28/21 03/01/21 - 77.9 %   MCV 84.6 80.0 - 100.0 fL   MCH 28.3 26.0 - 34.0 pg   MCHC 33.4 30.0 - 36.0 g/dL   RDW 39.0 30.0 - 92.3 %   Platelets 359 150 - 400 K/uL   nRBC 0.0 0.0 - 0.2 %  Comprehensive metabolic panel     Status: Abnormal   Collection Time: 02/27/21 12:58 AM  Result Value Ref Range   Sodium 137 135 - 145 mmol/L   Potassium 3.5 3.5 - 5.1 mmol/L   Chloride 105 98 - 111 mmol/L   CO2 22 22 - 32 mmol/L   Glucose, Bld 110 (H) 70 - 99 mg/dL   BUN 11 6 - 20 mg/dL   Creatinine, Ser 76.2 0.44 - 1.00 mg/dL   Calcium 9.3 8.9 - 03/01/21 mg/dL   Total Protein 7.5 6.5 - 8.1 g/dL   Albumin 4.0 3.5 - 5.0 g/dL   AST 22 15 - 41 U/L   ALT 24 0 - 44 U/L   Alkaline Phosphatase 59 38 - 126 U/L   Total Bilirubin 0.5 0.3 - 1.2 mg/dL   GFR, Estimated 2.63 33.5 mL/min   Anion gap 10 5 - 15  Lipase, blood     Status: None   Collection Time: 02/27/21 12:58 AM  Result Value Ref Range   Lipase 27 11 - 51 U/L  Amylase     Status: None   Collection Time: 02/27/21 12:58 AM  Result Value Ref Range   Amylase 41 28 - 100 U/L   --/--/O POS (01/27 1512)  IMAGING   MAU Management/MDM: Ordered CBC, CMET, amylase and lipase   These are all within normal limits for pregnancy.    Treatments in MAU included GI cocktail (to soothe throat, which it did), Pepcid, Phenergan.   We also gave IV fluids for rehydration.  Patient felt much better after these treatments.  Discussed will prescribe Zofran and phenergan for nausea and Pepcid for acid reduction.   ASSESSMENT Single IUP at [redacted]w[redacted]d [place] and vomiting, pregnancy related vs gastroenteritis Dehydration.  PLAN Discharge home Rx Phenergan and zofran for nausea Rx  Pepcid for acid reduction List of OB providers given Outpatient followup 09-07-2001 ordered for viability  Pt stable at time of discharge. Encouraged to return here if she develops worsening of symptoms, increase in pain, fever, or other concerning symptoms.    [redacted]w[redacted]d CNM, MSN Certified Nurse-Midwife 02/27/2021  12:19 AM

## 2021-02-27 NOTE — Discharge Instructions (Signed)
[facility] Providers   [facility]             [address], [place], [place] [address] [phone]  [facility]                                                             [address], [address], [place], [place], [address] [phone]  [facility]                                    [address], [address], [place], [place], [address] [phone]  [facility] [place] [address], [address], [place], [place], [address] [phone]  [facility]                                 [address], [place], [place], [address] [phone]  [facility]                                    [address], [place], [place], [address] [phone]  [facility] [address], [address], [place], [place], [address]                              [facility] [address], [address], [place], [place], [address] [phone]  [facility]         Phone: [phone]  [facility]      Phone: [phone]   [facility]      Phone: [phone]  [facility]         Phone: [phone]   [facility]    Phone: [phone]  [facility]      Phone: [phone]  [facility]      Phone: [phone]  [facility]      Phone: [phone]  

## 2021-03-04 ENCOUNTER — Encounter (HOSPITAL_COMMUNITY): Payer: Self-pay | Admitting: Obstetrics and Gynecology

## 2021-03-04 ENCOUNTER — Inpatient Hospital Stay (HOSPITAL_COMMUNITY)
Admission: AD | Admit: 2021-03-04 | Discharge: 2021-03-04 | Disposition: A | Discharge status: Home / Self Care | Payer: Medicaid Other | Attending: Obstetrics and Gynecology | Admitting: Obstetrics and Gynecology

## 2021-03-04 ENCOUNTER — Other Ambulatory Visit: Payer: Self-pay

## 2021-03-04 DIAGNOSIS — Z3A08 8 weeks gestation of pregnancy: Secondary | ICD-10-CM | POA: Insufficient documentation

## 2021-03-04 DIAGNOSIS — Z79899 Other long term (current) drug therapy: Secondary | ICD-10-CM | POA: Diagnosis not present

## 2021-03-04 DIAGNOSIS — O219 Vomiting of pregnancy, unspecified: Secondary | ICD-10-CM | POA: Diagnosis not present

## 2021-03-04 DIAGNOSIS — Z760 Encounter for issue of repeat prescription: Secondary | ICD-10-CM | POA: Diagnosis not present

## 2021-03-04 DIAGNOSIS — R42 Dizziness and giddiness: Secondary | ICD-10-CM | POA: Diagnosis not present

## 2021-03-04 LAB — URINALYSIS, ROUTINE W REFLEX MICROSCOPIC
Bilirubin Urine: NEGATIVE
Glucose, UA: NEGATIVE mg/dL
Ketones, ur: NEGATIVE mg/dL
Leukocytes,Ua: NEGATIVE
Nitrite: NEGATIVE
Protein, ur: NEGATIVE mg/dL
Specific Gravity, Urine: 1.015 (ref 1.005–1.030)
pH: 7 (ref 5.0–8.0)

## 2021-03-04 LAB — URINALYSIS, MICROSCOPIC (REFLEX)

## 2021-03-04 MED ORDER — PROCHLORPERAZINE MALEATE 5 MG PO TABS
5.0000 mg | ORAL_TABLET | Freq: Once | ORAL | Status: DC
  Filled 2021-03-04: qty 1

## 2021-03-04 MED ORDER — ONDANSETRON 4 MG PO TBDP: 4.0000 mg | ORAL_TABLET | Freq: Four times a day (QID) | ORAL | 1 refills | Status: DC | PRN

## 2021-03-04 MED ORDER — SCOPOLAMINE 1 MG/3DAYS TD PT72
1.0000 | MEDICATED_PATCH | TRANSDERMAL | Status: DC
  Filled 2021-03-04: qty 1

## 2021-03-04 MED ORDER — LACTATED RINGERS IV BOLUS: 1000.0000 mL | Freq: Once | INTRAVENOUS | Status: DC

## 2021-03-04 MED ORDER — PROCHLORPERAZINE MALEATE 5 MG PO TABS: 5.0000 mg | ORAL_TABLET | Freq: Four times a day (QID) | ORAL | 0 refills | Status: DC | PRN

## 2021-03-04 MED ORDER — PROCHLORPERAZINE EDISYLATE 10 MG/2ML IJ SOLN
10.0000 mg | Freq: Four times a day (QID) | INTRAMUSCULAR | Status: DC | PRN
  Filled 2021-03-04: qty 2

## 2021-03-04 NOTE — Discharge Instructions (Signed)
Vomiting in First Trimester Follow these instructions at home: To help relieve your symptoms, listen to your body. Everyone is different and has different preferences. Find what works best for you. Here are some things you can try to help relieve your symptoms: Meals and snacks Eat 5-6 small meals daily instead of 3 large meals. Eating small meals and snacks can help you avoid an empty stomach. Before getting out of bed, eat a couple of crackers to avoid moving around on an empty stomach. Eat a protein-rich snack before bed. Examples include cheese and crackers, or a peanut butter sandwich made with 1 slice of whole-wheat bread and 1 tsp (5 g) of peanut butter. Eat and drink slowly. Try eating starchy foods as these are usually tolerated well. Examples include cereal, toast, bread, potatoes, pasta, rice, and pretzels. Eat at least one serving of protein with your meals and snacks. Protein options include lean meats, poultry, seafood, beans, nuts, nut butters, eggs, cheese, and yogurt. Eat or suck on things that have ginger in them. It may help to relieve nausea. Add  tsp (0.44 g) ground ginger to hot tea, or choose ginger tea.   Fluids It is important to stay hydrated. Try to: Drink small amounts of fluids often. Drink fluids 30 minutes before or after a meal to help lessen the feeling of a full stomach. Drink 100% fruit juice or an electrolyte drink. An electrolyte drink contains sodium, potassium, and chloride. Drink fluids that are cold, clear, and carbonated or sour. These include lemonade, ginger ale, lemon-lime soda, ice water, and sparkling water. Things to avoid Avoid the following: Eating foods that trigger your symptoms. These may include spicy foods, coffee, high-fat foods, very sweet foods, and acidic foods. Drinking more than 1 cup of fluid at a time. Skipping meals. Nausea can be more intense on an empty stomach. If you cannot tolerate food, do not force it. Try sucking on ice  chips or other frozen items and make up for missed calories later. Lying down within 2 hours after eating. Being exposed to environmental triggers. These may include food smells, smoky rooms, closed spaces, rooms with strong smells, warm or humid places, overly loud and noisy rooms, and rooms with motion or flickering lights. Try eating meals in a well-ventilated area that is free of strong smells. Making quick and sudden changes in your movement. Taking iron pills and multivitamins that contain iron. If you take prescription iron pills, do not stop taking them unless your health care provider approves. Preparing food. The smell of food can spoil your appetite or trigger nausea. General instructions Brush your teeth or use a mouth rinse after meals. Take over-the-counter and prescription medicines only as told by your health care provider. Follow instructions from your health care provider about eating or drinking restrictions. Talk with your health care provider about starting a supplement of vitamin B6. Continue to take your prenatal vitamins as told by your health care provider. If you are having trouble taking your prenatal vitamins, talk with your health care provider about other options. Keep all follow-up visits. This is important. Follow-up visits include prenatal visits. Contact a health care provider if: You have pain in your abdomen. You have a severe headache. You have vision problems. You are losing weight. You feel weak or dizzy. You cannot eat or drink without vomiting, especially if this goes on for a full day. Get help right away if: You cannot drink fluids without vomiting. You vomit blood. You have constant   nausea and vomiting. You are very weak. You faint. You have a fever and your symptoms suddenly get worse. Summary Making some changes to your eating habits may help relieve nausea and vomiting. This condition may be managed with lifestyle changes and medicines as  prescribed by your health care provider. If medicines do not help relieve nausea and vomiting, you may need to receive fluids through an IV at the hospital. This information is not intended to replace advice given to you by your health care provider. Make sure you discuss any questions you have with your health care provider. Document Revised: 08/10/2019 Document Reviewed: 08/10/2019 Elsevier Patient Education  2021 Elsevier Inc.  

## 2021-03-04 NOTE — MAU Note (Signed)
Susan Decker is a 26 y.o. at [redacted]w[redacted]d here in MAU reporting: severe nausea and dizziness. Also reports she cannot eat because of the nausea. States the zofran is helping but she only has 3 pills left. No vomiting. Having some left sided abdominal pain. No bleeding or discharge.  Last dose: Phenergan: states not taking Zofran: 0916 Pepcid: states not taking  Onset of complaint: ongoing  Pain score: 2/10  Vitals:   03/04/21 1524  BP: 128/68  Pulse: 77  Resp: 16  Temp: 98.1 F (36.7 C)  SpO2: 97%     Lab orders placed from triage: UA

## 2021-03-04 NOTE — MAU Provider Note (Signed)
History     CSN: [id number]  Arrival date and time: 03/04/21 1503   Event Date/Time   First Provider Initiated Contact with Patient 03/04/21 1543      Chief Complaint  Patient presents with   Nausea   Dizziness   HPI Susan Decker is a 26 y.o. G3P0020 at [redacted]w[redacted]d who presents to MAU with chief complaint of nausea, vomiting and dizziness. These are recurrent problems. Patient reports success with previously prescribed medications, with Zofran as her primary intervention. She voices concern that she is almost out of Zofran and needs refills. Patient is able to tolerate rice, potato chips and "very cold water" without difficulty.  Patient also reports dizziness, recurrent, not associated with weakness or syncope. She denies abdominal pain, dysuria, fever or recent illness.  OB History     Gravida  3   Para      Term      Preterm      AB  2   Living         SAB  1   IAB  1   Ectopic      Multiple      Live Births              Past Medical History:  Diagnosis Date   Bronchitis    Medical history non-contributory     Past Surgical History:  Procedure Laterality Date   NO PAST SURGERIES      Family History  Problem Relation Age of Onset   Hypertension Mother    Diabetes Mother    Hypertension Father    Healthy Father     Social History   Tobacco Use   Smoking status: Every Day   Smokeless tobacco: Never  Vaping Use   Vaping Use: Never used  Substance Use Topics   Alcohol use: Not Currently   Drug use: Not Currently    Types: Marijuana    Allergies:  Allergies  Allergen Reactions   Latex Itching    No medications prior to admission.    Review of Systems  Gastrointestinal:  Positive for nausea and vomiting.  Neurological:  Positive for dizziness.  All other systems reviewed and are negative. Physical Exam   Blood pressure 128/68, pulse 77, temperature 98.1 F (36.7 C), temperature source Oral, resp. rate 16, height 5\' 4"  (1.626 m),  weight 81.5 kg, last menstrual period 01/07/2021, SpO2 97 %.  Physical Exam Vitals and nursing note reviewed. Exam conducted with a chaperone present.  Constitutional:      General: She is not in acute distress.    Appearance: Normal appearance. She is not ill-appearing.  Cardiovascular:     Rate and Rhythm: Normal rate and regular rhythm.  Pulmonary:     Effort: Pulmonary effort is normal.     Breath sounds: Normal breath sounds.  Abdominal:     General: Abdomen is flat.  Skin:    Capillary Refill: Capillary refill takes less than 2 seconds.  Neurological:     Mental Status: She is alert and oriented to person, place, and time.  Psychiatric:        Mood and Affect: Mood normal.        Behavior: Behavior normal.        Thought Content: Thought content normal.        Judgment: Judgment normal.    MAU Course/MDM  Procedures  --During initial conversation with CNM, patient agreeable to Compazine PO, Scopolamine patch. Patient declined blood work, IV fluid  bolus.  --When Haywood Lasso, RN entered room to administer medications patient voiced that she would prefer to review medications prior to receiving them  --CNM returned to bedside. Patient offered refill of Zofran due to lack of [facility] provider. Will also send Compazine to pharmacy and patient can elect to initiate once she reviews data to her comfort. Will discharge home due to patient declining other interventions.  Patient Vitals for the past 24 hrs:  BP Temp Temp src Pulse Resp SpO2 Height Weight  03/04/21 1524 128/68 98.1 F (36.7 C) Oral 77 16 97 % -- --  03/04/21 1520 -- -- -- -- -- -- 5\' 4"  (1.626 m) 81.5 kg   Results for orders placed or performed during the hospital encounter of 03/04/21 (from the past 24 hour(s))  Urinalysis, Routine w reflex microscopic Urine, Clean Catch     Status: Abnormal   Collection Time: 03/04/21  3:15 PM  Result Value Ref Range   Color, Urine YELLOW YELLOW   APPearance CLEAR CLEAR   Specific  Gravity, Urine 1.015 1.005 - 1.030   pH 7.0 5.0 - 8.0   Glucose, UA NEGATIVE NEGATIVE mg/dL   Hgb urine dipstick TRACE (A) NEGATIVE   Bilirubin Urine NEGATIVE NEGATIVE   Ketones, ur NEGATIVE NEGATIVE mg/dL   Protein, ur NEGATIVE NEGATIVE mg/dL   Nitrite NEGATIVE NEGATIVE   Leukocytes,Ua NEGATIVE NEGATIVE  Urinalysis, Microscopic (reflex)     Status: Abnormal   Collection Time: 03/04/21  3:15 PM  Result Value Ref Range   RBC / HPF 0-5 0 - 5 RBC/hpf   WBC, UA 0-5 0 - 5 WBC/hpf   Bacteria, UA RARE (A) NONE SEEN   Squamous Epithelial / LPF 0-5 0 - 5   Mucus PRESENT    Meds ordered this encounter  Medications   DISCONTD: prochlorperazine (COMPAZINE) injection 10 mg   DISCONTD: lactated ringers bolus 1,000 mL   scopolamine (TRANSDERM-SCOP) 1 MG/3DAYS 1.5 mg   prochlorperazine (COMPAZINE) tablet 5 mg   ondansetron (ZOFRAN-ODT) 4 MG disintegrating tablet    Sig: Take 1 tablet (4 mg total) by mouth every 6 (six) hours as needed for up to 40 doses for nausea.    Dispense:  20 tablet    Refill:  1    Order Specific Question:   Supervising Provider    Answer:   05/02/21   prochlorperazine (COMPAZINE) 5 MG tablet    Sig: Take 1 tablet (5 mg total) by mouth every 6 (six) hours as needed for nausea or vomiting.    Dispense:  30 tablet    Refill:  0    Order Specific Question:   Supervising Provider    Answer:   Fabio Bering Milas Hock   Assessment and Plan  --26 y.o. G3P0020 at [redacted]w[redacted]d  --GS + YS visualized on imaging from 02/24/2021 --Vomiting in pregnancy --Zofran and Compazine to pharmacy --All other interventions and assessments declined by patient --Discharge home in stable condition  F/U: --Patient to select [facility] Provider and schedule care --Discussed with patient MAU is considered ED for pregnancy-related emergencies, typically only issue medication refills if need arises in the course of evaluation. Encouraged patient to select [facility] Provider, schedule OB intake so  refills can be maintained by outpatient source, not ED environment. Patient verbalized understanding  [facility], CNM 03/04/2021, 4:37 PM

## 2021-03-13 ENCOUNTER — Inpatient Hospital Stay (HOSPITAL_COMMUNITY): Payer: Medicaid Other

## 2021-03-13 ENCOUNTER — Inpatient Hospital Stay (HOSPITAL_COMMUNITY)
Admission: AD | Admit: 2021-03-13 | Discharge: 2021-03-13 | Disposition: A | Discharge status: Home / Self Care | Payer: Medicaid Other | Attending: Family Medicine | Admitting: Family Medicine

## 2021-03-13 ENCOUNTER — Other Ambulatory Visit: Payer: Self-pay

## 2021-03-13 ENCOUNTER — Encounter (HOSPITAL_COMMUNITY): Payer: Self-pay | Admitting: Family Medicine

## 2021-03-13 DIAGNOSIS — O26891 Other specified pregnancy related conditions, first trimester: Secondary | ICD-10-CM | POA: Insufficient documentation

## 2021-03-13 DIAGNOSIS — O3680X0 Pregnancy with inconclusive fetal viability, not applicable or unspecified: Secondary | ICD-10-CM | POA: Diagnosis not present

## 2021-03-13 DIAGNOSIS — R109 Unspecified abdominal pain: Secondary | ICD-10-CM | POA: Insufficient documentation

## 2021-03-13 DIAGNOSIS — N898 Other specified noninflammatory disorders of vagina: Secondary | ICD-10-CM | POA: Insufficient documentation

## 2021-03-13 DIAGNOSIS — Z3A09 9 weeks gestation of pregnancy: Secondary | ICD-10-CM | POA: Diagnosis not present

## 2021-03-13 LAB — URINALYSIS, ROUTINE W REFLEX MICROSCOPIC
Bilirubin Urine: NEGATIVE
Glucose, UA: NEGATIVE mg/dL
Hgb urine dipstick: NEGATIVE
Ketones, ur: NEGATIVE mg/dL
Leukocytes,Ua: NEGATIVE
Nitrite: NEGATIVE
Protein, ur: NEGATIVE mg/dL
Specific Gravity, Urine: 1.021 (ref 1.005–1.030)
pH: 7 (ref 5.0–8.0)

## 2021-03-13 LAB — WET PREP, GENITAL
Clue Cells Wet Prep HPF POC: NONE SEEN
Sperm: NONE SEEN
Trich, Wet Prep: NONE SEEN
WBC, Wet Prep HPF POC: <10 (ref <10)
Yeast Wet Prep HPF POC: NONE SEEN

## 2021-03-13 NOTE — MAU Provider Note (Signed)
History     CSN: [id number]  Arrival date and time: 03/13/21 1212   Event Date/Time   First Provider Initiated Contact with Patient 03/13/21 1356       Chief Complaint  Patient presents with   Abdominal Pain   Vaginal Bleeding   HPI This is a 26 year old G3, P0 at 9 weeks and 2 days who presents with brown discharge over the past 2 days.  She was seen on 1/27 due to vaginal bleeding.  She had a gestational sac with no embryo.  She was scheduled for a repeat ultrasound, however this has not been done yet.  The brown discharge is mainly when she wipes.  It is mucus in consistency.  No cramping, pain.  OB History     Gravida  3   Para      Term      Preterm      AB  2   Living         SAB  1   IAB  1   Ectopic      Multiple      Live Births              Past Medical History:  Diagnosis Date   Bronchitis    Medical history non-contributory     Past Surgical History:  Procedure Laterality Date   NO PAST SURGERIES      Family History  Problem Relation Age of Onset   Hypertension Mother    Diabetes Mother    Hypertension Father    Healthy Father     Social History   Tobacco Use   Smoking status: Every Day   Smokeless tobacco: Never  Vaping Use   Vaping Use: Never used  Substance Use Topics   Alcohol use: Not Currently   Drug use: Not Currently    Types: Marijuana    Allergies:  Allergies  Allergen Reactions   Latex Itching    Medications Prior to Admission  Medication Sig Dispense Refill Last Dose   ondansetron (ZOFRAN-ODT) 4 MG disintegrating tablet Take 1 tablet (4 mg total) by mouth every 6 (six) hours as needed for up to 40 doses for nausea. 20 tablet 1 03/12/2021   famotidine (PEPCID) 20 MG tablet Take 1 tablet (20 mg total) by mouth 2 (two) times daily. 30 tablet 0    prochlorperazine (COMPAZINE) 5 MG tablet Take 1 tablet (5 mg total) by mouth every 6 (six) hours as needed for nausea or vomiting. 30 tablet 0    promethazine  (PHENERGAN) 25 MG tablet Take 1 tablet (25 mg total) by mouth every 6 (six) hours as needed for nausea or vomiting. 30 tablet 2     Review of Systems Physical Exam   Blood pressure 124/67, pulse 100, temperature 98.6 F (37 C), temperature source Oral, resp. rate 15, height 5\' 4"  (1.626 m), weight 81.2 kg, last menstrual period 01/07/2021, SpO2 100 %.  Physical Exam Vitals and nursing note reviewed.  Constitutional:      Appearance: She is well-developed.  HENT:     Head: Normocephalic and atraumatic.  Cardiovascular:     Rate and Rhythm: Normal rate and regular rhythm.     Heart sounds: Normal heart sounds.  Pulmonary:     Effort: Pulmonary effort is normal.     Breath sounds: Normal breath sounds.  Abdominal:     General: Abdomen is flat and scaphoid. Bowel sounds are normal.     Palpations: Abdomen is soft.  Tenderness: There is no abdominal tenderness.  Skin:    Capillary Refill: Capillary refill takes less than 2 seconds.  Neurological:     General: No focal deficit present.     Mental Status: She is alert.   Results for orders placed or performed during the hospital encounter of 03/13/21 (from the past 24 hour(s))  Urinalysis, Routine w reflex microscopic Urine, Clean Catch     Status: Abnormal   Collection Time: 03/13/21 12:35 PM  Result Value Ref Range   Color, Urine YELLOW YELLOW   APPearance CLOUDY (A) CLEAR   Specific Gravity, Urine 1.021 1.005 - 1.030   pH 7.0 5.0 - 8.0   Glucose, UA NEGATIVE NEGATIVE mg/dL   Hgb urine dipstick NEGATIVE NEGATIVE   Bilirubin Urine NEGATIVE NEGATIVE   Ketones, ur NEGATIVE NEGATIVE mg/dL   Protein, ur NEGATIVE NEGATIVE mg/dL   Nitrite NEGATIVE NEGATIVE   Leukocytes,Ua NEGATIVE NEGATIVE  Wet prep, genital     Status: None   Collection Time: 03/13/21 12:45 PM   Specimen: Vaginal; Genital  Result Value Ref Range   Yeast Wet Prep HPF POC NONE SEEN NONE SEEN   Trich, Wet Prep NONE SEEN NONE SEEN   Clue Cells Wet Prep HPF POC  NONE SEEN NONE SEEN   WBC, Wet Prep HPF POC <10 <10   Sperm NONE SEEN    US OB Transvaginal  Result Date: 03/13/2021 CLINICAL DATA:  Assess viability. EXAM: TRANSVAGINAL OB ULTRASOUND TECHNIQUE: Transvaginal ultrasound was performed for complete evaluation of the gestation as well as the maternal uterus, adnexal regions, and pelvic cul-de-sac. COMPARISON:  02/24/2021. FINDINGS: Intrauterine gestational sac: Single. Yolk sac:  Present. Embryo:  Present. Cardiac Activity: Present. Heart Rate: 164 bpm CRL:   17.2 mm   8 w 1 d                  [place] EDC: 10/22/2021 Subchorionic hemorrhage:  None visualized. Maternal uterus/adnexae: Normal ovaries.  No free fluid. IMPRESSION: Single living intrauterine pregnancy with gestational age [redacted] weeks 1 day and estimated date of confinement 10/22/2021. Electronically Signed   By: Leanna Battles M.D.   On: 03/13/2021 14:48     MAU Course  Procedures  MDM Imaging: I independent reviewed the images of the [place]. IUP measuring [redacted]w[redacted]d with cardiac activity of 164. Ovaries normal.   Assessment and Plan   1. [redacted] weeks gestation of pregnancy   2. Pregnancy with uncertain fetal viability   3. Vaginal discharge during pregnancy in first trimester    Viable IUP. Discharge to home with return precautions.  Levie Heritage 03/13/2021, 1:56 PM

## 2021-03-13 NOTE — MAU Note (Signed)
Pt reports brown discharge "tissue" since  Saturday. Mild cramping since yesterday. Denies dysuria.

## 2021-03-14 LAB — GC/CHLAMYDIA PROBE AMP (~~LOC~~) NOT AT ARMC
Chlamydia: NEGATIVE
Comment: NEGATIVE
Comment: NORMAL
Neisseria Gonorrhea: NEGATIVE

## 2021-03-29 ENCOUNTER — Inpatient Hospital Stay (HOSPITAL_COMMUNITY)
Admission: AD | Admit: 2021-03-29 | Payer: Medicaid Other | Source: Home / Self Care | Admitting: Obstetrics & Gynecology

## 2021-10-31 ENCOUNTER — Encounter: Payer: Self-pay | Admitting: Emergency Medicine

## 2021-10-31 ENCOUNTER — Ambulatory Visit
Admission: EM | Admit: 2021-10-31 | Discharge: 2021-10-31 | Disposition: A | Discharge status: Home / Self Care | Payer: Medicaid Other

## 2021-10-31 DIAGNOSIS — R1084 Generalized abdominal pain: Secondary | ICD-10-CM | POA: Diagnosis not present

## 2021-10-31 DIAGNOSIS — R197 Diarrhea, unspecified: Secondary | ICD-10-CM

## 2021-10-31 MED ORDER — ONDANSETRON 4 MG PO TBDP: 4.0000 mg | ORAL_TABLET | Freq: Four times a day (QID) | ORAL | 1 refills | Status: DC | PRN

## 2021-10-31 MED ORDER — FAMOTIDINE 20 MG PO TABS: 20.0000 mg | ORAL_TABLET | Freq: Two times a day (BID) | ORAL | 0 refills | Status: DC

## 2021-10-31 NOTE — ED Provider Notes (Signed)
[name]    CSN: [id number] Arrival date & time: 10/31/21  1048      History   Chief Complaint Chief Complaint  Patient presents with   Abdominal Pain    Severe stomach and abdominal pain and diarrhea. I just recently had an emergency C-section, and I was taking medications. This happened the night before yesterday after I woke up at 5 Am and took IBprofen 600mg  , thats when my stomach pain started. - Entered by patient    HPI Susan Decker is a 26 y.o. female.   26 year old female presents with abdominal pain and diarrhea.  Patient indicates that she had a C-section performed on 10/19/2021.  She has been doing well without any complications and has been taking ibuprofen and pain medicine to help relieve her discomfort.  She indicates that 2 days ago she took some ibuprofen and shortly afterwards she started having abdominal pain.  She says that the pain is sharp epigastric that does travel to the middle and lower part of the abdomen, she is having cramps when it occurs, lasting for several minutes and then resolves.  She indicates that the pain is intermittent.  She denies having any nausea or vomiting.  She indicates that she is not having any real heartburn or indigestion.  She does indicate that when she started having the pain she also started having some diarrhea, loose, watery and frequent.  She indicates yesterday that she did have a mild fever which was 99-200 which is also intermittent.  She relates she is able to tolerate fluids well, and is eating a bland diet she indicates she has not been around any family or friends that have been sick.  She indicates that she feels okay in between the stomach pain episodes.  She has not taken any ibuprofen since 2 days ago.   Abdominal Pain   Past Medical History:  Diagnosis Date   Bronchitis    Medical history non-contributory     There are no problems to display for this patient.   Past Surgical History:  Procedure  Laterality Date   NO PAST SURGERIES      OB History     Gravida  3   Para      Term      Preterm      AB  2   Living         SAB  1   IAB  1   Ectopic      Multiple      Live Births               Home Medications    Prior to Admission medications   Medication Sig Start Date End Date Taking? Authorizing Provider  cephALEXin (KEFLEX) 500 MG capsule Take 500 mg by mouth 3 (three) times daily. 10/25/21   [provider]  famotidine (PEPCID) 20 MG tablet Take 1 tablet (20 mg total) by mouth 2 (two) times daily. 10/31/21   Nyoka Lint, PA-C  ondansetron (ZOFRAN-ODT) 4 MG disintegrating tablet Take 1 tablet (4 mg total) by mouth every 6 (six) hours as needed for up to 40 doses for nausea. 10/31/21   Nyoka Lint, PA-C  oxyCODONE-acetaminophen (PERCOCET/ROXICET) 5-325 MG tablet Take by mouth. 10/25/21   [provider]  prochlorperazine (COMPAZINE) 5 MG tablet Take 1 tablet (5 mg total) by mouth every 6 (six) hours as needed for nausea or vomiting. 03/04/21   Darlina Rumpf, CNM  promethazine (PHENERGAN) 25  MG tablet Take 1 tablet (25 mg total) by mouth every 6 (six) hours as needed for nausea or vomiting. 02/27/21   Seabron Spates, CNM    Family History Family History  Problem Relation Age of Onset   Hypertension Mother    Diabetes Mother    Hypertension Father    Healthy Father     Social History Social History   Tobacco Use   Smoking status: Every Day   Smokeless tobacco: Never  Vaping Use   Vaping Use: Never used  Substance Use Topics   Alcohol use: Not Currently   Drug use: Not Currently    Types: Marijuana     Allergies   Latex   Review of Systems Review of Systems  Gastrointestinal:  Positive for abdominal pain (epigastric).     Physical Exam Triage Vital Signs ED Triage Vitals  Enc Vitals Group     BP 10/31/21 1119 127/82     Pulse Rate 10/31/21 1119 97     Resp 10/31/21 1119 18     Temp 10/31/21 1119 98.1  F (36.7 C)     Temp src --      SpO2 10/31/21 1119 98 %     Weight --      Height --      Head Circumference --      Peak Flow --      Pain Score 10/31/21 1117 5     Pain Loc --      Pain Edu? --      Excl. in [place]? --    No data found.  Updated Vital Signs BP 127/82   Pulse 97   Temp 98.1 F (36.7 C)   Resp 18   LMP 01/07/2021   SpO2 98%   Breastfeeding No   Visual Acuity Right Eye Distance:   Left Eye Distance:   Bilateral Distance:    Right Eye Near:   Left Eye Near:    Bilateral Near:     Physical Exam Constitutional:      Appearance: She is well-developed.  Cardiovascular:     Rate and Rhythm: Normal rate and regular rhythm.     Heart sounds: Normal heart sounds.  Pulmonary:     Effort: Pulmonary effort is normal.     Breath sounds: Normal breath sounds and air entry. No wheezing, rhonchi or rales.  Abdominal:     General: Abdomen is flat. Bowel sounds are normal.     Palpations: Abdomen is soft.     Tenderness: There is abdominal tenderness (mild) in the epigastric area. There is no guarding or rebound.  Lymphadenopathy:     Cervical: No cervical adenopathy.  Neurological:     Mental Status: She is alert.      UC Treatments / Results  Labs (all labs ordered are listed, but only abnormal results are displayed) Labs Reviewed - No data to display  EKG   Radiology No results found.  Procedures Procedures (including critical care time)  Medications Ordered in UC Medications - No data to display  Initial Impression / Assessment and Plan / UC Course  I have reviewed the triage vital signs and the nursing notes.  Pertinent labs & imaging results that were available during my care of the patient were reviewed by me and considered in my medical decision making (see chart for details).    Plan: 1.  The abdominal pain will be treated with the following: A.  Pepcid 20 mg twice daily to  help control stomach pain. B.  Patient advised to not take  any ibuprofen as this may be a causative factor in her discomfort. 2.  The diarrhea will be treated with the following: A.  Zofran every 6 hours as needed for cramping and diarrhea. 3.  Advised to follow-up with GYN specialist or to the emergency room if symptoms fail to improve over the next 72 hours or worsens. Final Clinical Impressions(s) / UC Diagnoses   Final diagnoses:  Generalized abdominal pain  Diarrhea, unspecified type     Discharge Instructions      Advised to stop the ibuprofen as this may be contributing to the stomach pain. Advised take Pepcid 20 mg twice daily to help calm down the stomach irritation and pain. Advised take the Zofran every 6 hours to help slow down the stomach cramping and the diarrhea. Advised to follow-up with GYN specialist or return to urgent care if symptoms fail to improve.    ED Prescriptions     Medication Sig Dispense Auth. Provider   ondansetron (ZOFRAN-ODT) 4 MG disintegrating tablet Take 1 tablet (4 mg total) by mouth every 6 (six) hours as needed for up to 40 doses for nausea. 20 tablet Nyoka Lint, PA-C   famotidine (PEPCID) 20 MG tablet Take 1 tablet (20 mg total) by mouth 2 (two) times daily. 30 tablet Nyoka Lint, PA-C      PDMP not reviewed this encounter.   Nyoka Lint, PA-C 10/31/21 1139

## 2021-10-31 NOTE — Discharge Instructions (Signed)
Advised to stop the ibuprofen as this may be contributing to the stomach pain. Advised take Pepcid 20 mg twice daily to help calm down the stomach irritation and pain. Advised take the Zofran every 6 hours to help slow down the stomach cramping and the diarrhea. Advised to follow-up with GYN specialist or return to urgent care if symptoms fail to improve.

## 2021-10-31 NOTE — ED Triage Notes (Signed)
Pt is present today with c/o upper abdominal pain and diarrhea., Pt sx started after she took ibuprofen 600 mg x2 days ago.

## 2022-10-07 ENCOUNTER — Emergency Department (HOSPITAL_COMMUNITY)
Admission: EM | Admit: 2022-10-07 | Discharge: 2022-10-08 | Disposition: A | Discharge status: Home / Self Care | Payer: Medicaid Other | Attending: Emergency Medicine | Admitting: Emergency Medicine

## 2022-10-07 ENCOUNTER — Encounter (HOSPITAL_COMMUNITY): Payer: Self-pay | Admitting: Emergency Medicine

## 2022-10-07 ENCOUNTER — Other Ambulatory Visit: Payer: Self-pay

## 2022-10-07 DIAGNOSIS — R42 Dizziness and giddiness: Secondary | ICD-10-CM | POA: Diagnosis present

## 2022-10-07 DIAGNOSIS — H938X9 Other specified disorders of ear, unspecified ear: Secondary | ICD-10-CM | POA: Insufficient documentation

## 2022-10-07 DIAGNOSIS — Z20822 Contact with and (suspected) exposure to covid-19: Secondary | ICD-10-CM | POA: Diagnosis not present

## 2022-10-07 LAB — URINALYSIS, ROUTINE W REFLEX MICROSCOPIC
Bilirubin Urine: NEGATIVE
Glucose, UA: NEGATIVE mg/dL
Ketones, ur: NEGATIVE mg/dL
Leukocytes,Ua: NEGATIVE
Nitrite: NEGATIVE
Protein, ur: NEGATIVE mg/dL
Specific Gravity, Urine: 1.014 (ref 1.005–1.030)
pH: 6 (ref 5.0–8.0)

## 2022-10-07 MED ORDER — MECLIZINE HCL 25 MG PO TABS
12.5000 mg | ORAL_TABLET | Freq: Once | ORAL | Status: AC
  Administered 2022-10-08: 12.5 mg via ORAL
  Filled 2022-10-07: qty 1

## 2022-10-07 MED ORDER — OXYMETAZOLINE HCL 0.05 % NA SOLN
2.0000 | Freq: Once | NASAL | Status: AC
  Administered 2022-10-08: 2 via NASAL
  Filled 2022-10-07: qty 30

## 2022-10-07 NOTE — ED Triage Notes (Signed)
Pt c/o dizziness and lightheadedness x 3 days. Pt was seen at [facility] yesterday and told her was dehydrated. Notices the dizziness more when she bends over and stands up, turning over in bed, or turning her head.

## 2022-10-08 LAB — HCG, SERUM, QUALITATIVE: Preg, Serum: NEGATIVE

## 2022-10-08 LAB — RESP PANEL BY RT-PCR (RSV, FLU A&B, COVID)  RVPGX2
Influenza A by PCR: NEGATIVE
Influenza B by PCR: NEGATIVE
Resp Syncytial Virus by PCR: NEGATIVE
SARS Coronavirus 2 by RT PCR: NEGATIVE

## 2022-10-08 LAB — CBG MONITORING, ED: Glucose-Capillary: 98 mg/dL (ref 70–99)

## 2022-10-08 MED ORDER — MOMETASONE FUROATE 50 MCG/ACT NA SUSP: 2.0000 | Freq: Every day | NASAL | 0 refills | Status: DC

## 2022-10-08 NOTE — ED Notes (Signed)
Pt ambulated to bathroom without assistance 

## 2022-10-08 NOTE — ED Provider Notes (Signed)
[place] EMERGENC[facility] DE[facility]MENT AT [facility] Provider Note   CSN: [id number] Arrival date & time: 10/07/22  2036     History  Chief Complaint  Patient presents with   Dizziness    Susan Decker is a 27 y.o. female.  The history is provided by the patient.  Dizziness Quality:  Head spinning Severity:  Severe Onset quality:  Sudden Timing:  Constant Progression:  Resolved Chronicity:  New Context: head movement   Relieved by:  Nothing Worsened by:  Nothing Ineffective treatments:  None tried Associated symptoms: no shortness of breath and no vomiting   Associated symptoms comment:  Clogged ears  Risk factors: no Meniere's disease        Home Medications Prior to Admission medications   Medication Sig Start Date End Date Taking? Authorizing Provider  mometasone (NASONEX) 50 MCG/ACT nasal spray Place 2 sprays into the nose daily. 10/08/22  Yes Nazaire Cordial, MD  cephALEXin (KEFLEX) 500 MG capsule Take 500 mg by mouth 3 (three) times daily. 10/25/21   [provider]  famotidine (PEPCID) 20 MG tablet Take 1 tablet (20 mg total) by mouth 2 (two) times daily. 10/31/21   Ellsworth Lennox, PA-C  ondansetron (ZOFRAN-ODT) 4 MG disintegrating tablet Take 1 tablet (4 mg total) by mouth every 6 (six) hours as needed for up to 40 doses for nausea. 10/31/21   Ellsworth Lennox, PA-C  oxyCODONE-acetaminophen (PERCOCET/ROXICET) 5-325 MG tablet Take by mouth. 10/25/21   [provider]  prochlorperazine (COMPAZINE) 5 MG tablet Take 1 tablet (5 mg total) by mouth every 6 (six) hours as needed for nausea or vomiting. 03/04/21   Calvert Cantor, CNM  promethazine (PHENERGAN) 25 MG tablet Take 1 tablet (25 mg total) by mouth every 6 (six) hours as needed for nausea or vomiting. 02/27/21   Aviva Signs, CNM      Allergies    Latex    Review of Systems   Review of Systems  HENT:  Positive for congestion. Negative for postnasal drip.   Eyes:  Negative for  photophobia.  Respiratory:  Negative for cough, shortness of breath, wheezing and stridor.   Gastrointestinal:  Negative for vomiting.  Neurological:  Positive for dizziness. Negative for seizures, facial asymmetry, speech difficulty and numbness.  All other systems reviewed and are negative.   Physical Exam Updated Vital Signs BP 123/78   Pulse 76   Temp 98.9 F (37.2 C) (Oral)   Resp 18   Ht 5\' 4"  (1.626 m)   Wt 81 kg   LMP 09/20/2022   SpO2 100%   BMI 30.65 kg/m  Physical Exam Vitals and nursing note reviewed.  Constitutional:      General: She is not in acute distress.    Appearance: Normal appearance. She is well-developed.  HENT:     Head: Normocephalic and atraumatic.     Right Ear: Tympanic membrane and ear canal normal.     Left Ear: Tympanic membrane and ear canal normal.     Nose: Nose normal.     Mouth/Throat:     Mouth: Mucous membranes are moist.     Pharynx: Oropharynx is clear.  Eyes:     Extraocular Movements: Extraocular movements intact.     Pupils: Pupils are equal, round, and reactive to light.  Cardiovascular:     Rate and Rhythm: Normal rate and regular rhythm.     Pulses: Normal pulses.     Heart sounds: Normal heart sounds.  Pulmonary:  Effort: Pulmonary effort is normal. No respiratory distress.     Breath sounds: Normal breath sounds.  Abdominal:     General: Bowel sounds are normal. There is no distension.     Palpations: Abdomen is soft.     Tenderness: There is no abdominal tenderness. There is no guarding or rebound.  Musculoskeletal:        General: Normal range of motion.     Cervical back: Normal range of motion and neck supple.  Skin:    General: Skin is warm and dry.     Capillary Refill: Capillary refill takes less than 2 seconds.     Findings: No erythema or rash.  Neurological:     General: No focal deficit present.     Mental Status: She is alert.     Cranial Nerves: No cranial nerve deficit.     Deep Tendon  Reflexes: Reflexes normal.  Psychiatric:        Mood and Affect: Mood normal.     ED Results / Procedures / Treatments   Labs (all labs ordered are listed, but only abnormal results are displayed) Results for orders placed or performed during the hospital encounter of 10/07/22  Urinalysis, Routine w reflex microscopic -Urine, Clean Catch  Result Value Ref Range   Color, Urine STRAW (A) YELLOW   APPearance CLEAR CLEAR   Specific Gravity, Urine 1.014 1.005 - 1.030   pH 6.0 5.0 - 8.0   Glucose, UA NEGATIVE NEGATIVE mg/dL   Hgb urine dipstick SMALL (A) NEGATIVE   Bilirubin Urine NEGATIVE NEGATIVE   Ketones, ur NEGATIVE NEGATIVE mg/dL   Protein, ur NEGATIVE NEGATIVE mg/dL   Nitrite NEGATIVE NEGATIVE   Leukocytes,Ua NEGATIVE NEGATIVE   RBC / HPF 0-5 0 - 5 RBC/hpf   WBC, UA 0-5 0 - 5 WBC/hpf   Bacteria, UA RARE (A) NONE SEEN   Squamous Epithelial / HPF 0-5 0 - 5 /HPF   Mucus PRESENT   hCG, serum, qualitative  Result Value Ref Range   Preg, Serum NEGATIVE NEGATIVE  CBG monitoring, ED  Result Value Ref Range   Glucose-Capillary 98 70 - 99 mg/dL   No results found.   Radiology No results found.  Procedures Procedures    Medications Ordered in ED Medications  oxymetazoline (AFRIN) 0.05 % nasal spray 2 spray (2 sprays Each Nare Given 10/08/22 0024)  meclizine (ANTIVERT) tablet 12.5 mg (12.5 mg Oral Given 10/08/22 0024)    ED Course/ Medical Decision Making/ A&P                                 Medical Decision Making Vertigo with rolling over and clogged ears   Amount and/or Complexity of Data Reviewed External Data Reviewed: notes.    Details: Previous notes reviewed  Labs: ordered.    Details: Pregnancy negative.  Urine is negative for UTI  Risk OTC drugs. Risk Details: Patient is well appearing, nasal spray for congested ears.  Likely viral process vs. allergies causing congestion.  Stable for discharge,  strict return     Final Clinical Impression(s) / ED  Diagnoses Final diagnoses:  Vertigo  Ear congestion, unspecified laterality   Return for intractable cough, coughing up blood, fevers > 100.4 unrelieved by medication, shortness of breath, intractable vomiting, chest pain, shortness of breath, weakness, numbness, changes in speech, facial asymmetry, abdominal pain, passing out, Inability to tolerate liquids or food, cough, altered mental status or  any concerns. No signs of systemic illness or infection. The patient is nontoxic-appearing on exam and vital signs are within normal limits.  I have reviewed the triage vital signs and the nursing notes. Pertinent labs & imaging results that were available during my care of the patient were reviewed by me and considered in my medical decision making (see chart for details). After history, exam, and medical workup I feel the patient has been appropriately medically screened and is safe for discharge home. Pertinent diagnoses were discussed with the patient. Patient was given return precautions.    Rx / DC Orders ED Discharge Orders          Ordered    mometasone (NASONEX) 50 MCG/ACT nasal spray  Daily        10/08/22 0109              Bradley Handyside, MD 10/08/22 8756

## 2022-10-14 ENCOUNTER — Telehealth: Payer: Medicaid Other | Admitting: Nurse Practitioner

## 2022-10-14 DIAGNOSIS — R42 Dizziness and giddiness: Secondary | ICD-10-CM

## 2022-10-14 MED ORDER — MECLIZINE HCL 50 MG PO TABS: 25.0000 mg | ORAL_TABLET | Freq: Two times a day (BID) | ORAL | 0 refills | Status: DC | PRN

## 2022-10-14 NOTE — Progress Notes (Unsigned)
Virtual Visit Consent   Susan Decker, you are scheduled for a virtual visit with a [facility] provider today. Just as with appointments in the office, your consent must be obtained to participate. Your consent will be active for this visit and any virtual visit you may have with one of our providers in the next 365 days. If you have a MyChart account, a copy of this consent can be sent to you electronically.  As this is a virtual visit, video technology does not allow for your provider to perform a traditional examination. This may limit your provider's ability to fully assess your condition. If your provider identifies any concerns that need to be evaluated in person or the need to arrange testing (such as labs, EKG, etc.), we will make arrangements to do so. Although advances in technology are sophisticated, we cannot ensure that it will always work on either your end or our end. If the connection with a video visit is poor, the visit may have to be switched to a telephone visit. With either a video or telephone visit, we are not always able to ensure that we have a secure connection.  By engaging in this virtual visit, you consent to the provision of healthcare and authorize for your insurance to be billed (if applicable) for the services provided during this visit. Depending on your insurance coverage, you may receive a charge related to this service.  I need to obtain your verbal consent now. Are you willing to proceed with your visit today? [name] has provided verbal consent on 10/14/2022 for a virtual visit (video or telephone). Claiborne Rigg, NP  Date: 10/14/2022 12:19 PM  Virtual Visit via Video Note   I, Claiborne Rigg, connected with  Susan Decker  ([id number], August 18, 1995) on 10/14/22 at 12:15 PM EDT by a video-enabled telemedicine application and verified that I am speaking with the correct person using two identifiers.  Location: Patient: Virtual Visit Location Patient:  Home Provider: Virtual Visit Location Provider: Home Office   I discussed the limitations of evaluation and management by telemedicine and the availability of in person appointments. The patient expressed understanding and agreed to proceed.    History of Present Illness: Susan Decker is a 27 y.o. who identifies as a female who was assigned female at birth, and is being seen today for VERTIGO.   The dizziness has been present for several days. The patient describes the symptoms as vertigo. Symptoms are exacerbated by none identified The patient also complains of aural pressure. Patient denies otalgia otorrhea tinnitus hearing loss.  She has been treated with anti-inflammatory nasal sprays with no improvement.  Previous work up has been labs, EKG, viral panel, UA, HCG serum (all normal 10-07-2022) when see for evaluation in the emergency room   Problems: There are no problems to display for this patient.   Allergies:  Allergies  Allergen Reactions   Latex Itching   Medications:  Current Outpatient Medications:    meclizine (ANTIVERT) 50 MG tablet, Take 0.5-1 tablets (25-50 mg total) by mouth 2 (two) times daily as needed., Disp: 30 tablet, Rfl: 0   cephALEXin (KEFLEX) 500 MG capsule, Take 500 mg by mouth 3 (three) times daily., Disp: , Rfl:    famotidine (PEPCID) 20 MG tablet, Take 1 tablet (20 mg total) by mouth 2 (two) times daily., Disp: 30 tablet, Rfl: 0   mometasone (NASONEX) 50 MCG/ACT nasal spray, Place 2 sprays into the nose daily., Disp: 1 each, Rfl: 0  ondansetron (ZOFRAN-ODT) 4 MG disintegrating tablet, Take 1 tablet (4 mg total) by mouth every 6 (six) hours as needed for up to 40 doses for nausea., Disp: 20 tablet, Rfl: 1   oxyCODONE-acetaminophen (PERCOCET/ROXICET) 5-325 MG tablet, Take by mouth., Disp: , Rfl:    prochlorperazine (COMPAZINE) 5 MG tablet, Take 1 tablet (5 mg total) by mouth every 6 (six) hours as needed for nausea or vomiting., Disp: 30 tablet, Rfl: 0    promethazine (PHENERGAN) 25 MG tablet, Take 1 tablet (25 mg total) by mouth every 6 (six) hours as needed for nausea or vomiting., Disp: 30 tablet, Rfl: 2  Observations/Objective: Patient is well-developed, well-nourished in no acute distress.  Resting comfortably at home.  Head is normocephalic, atraumatic.  No labored breathing.  Speech is clear and coherent with logical content.  Patient is alert and oriented at baseline.    Assessment and Plan: 1. Vertigo - meclizine (ANTIVERT) 50 MG tablet; Take 0.5-1 tablets (25-50 mg total) by mouth 2 (two) times daily as needed.  Dispense: 30 tablet; Refill: 0 Needs to establish with PCP   Follow Up Instructions: I discussed the assessment and treatment plan with the patient. The patient was provided an opportunity to ask questions and all were answered. The patient agreed with the plan and demonstrated an understanding of the instructions.  A copy of instructions were sent to the patient via MyChart unless otherwise noted below.    The patient was advised to call back or seek an in-person evaluation if the symptoms worsen or if the condition fails to improve as anticipated.  Time:  I spent 12 minutes with the patient via telehealth technology discussing the above problems/concerns.    Claiborne Rigg, NP

## 2022-10-14 NOTE — Patient Instructions (Signed)
  Susan Decker, thank you for joining Susan Rigg, NP for today's virtual visit.  While this provider is not your primary care provider (PCP), if your PCP is located in our provider database this encounter information will be shared with them immediately following your visit.   A [name] MyChart account gives you access to today's visit and all your visits, tests, and labs performed at [facility] " click here if you don't have a [name] MyChart account or go to [email or web address]  Consent: (Patient) Susan Decker provided verbal consent for this virtual visit at the beginning of the encounter.  Current Medications:  Current Outpatient Medications:    meclizine (ANTIVERT) 50 MG tablet, Take 0.5-1 tablets (25-50 mg total) by mouth 2 (two) times daily as needed., Disp: 30 tablet, Rfl: 0   cephALEXin (KEFLEX) 500 MG capsule, Take 500 mg by mouth 3 (three) times daily., Disp: , Rfl:    famotidine (PEPCID) 20 MG tablet, Take 1 tablet (20 mg total) by mouth 2 (two) times daily., Disp: 30 tablet, Rfl: 0   mometasone (NASONEX) 50 MCG/ACT nasal spray, Place 2 sprays into the nose daily., Disp: 1 each, Rfl: 0   ondansetron (ZOFRAN-ODT) 4 MG disintegrating tablet, Take 1 tablet (4 mg total) by mouth every 6 (six) hours as needed for up to 40 doses for nausea., Disp: 20 tablet, Rfl: 1   oxyCODONE-acetaminophen (PERCOCET/ROXICET) 5-325 MG tablet, Take by mouth., Disp: , Rfl:    prochlorperazine (COMPAZINE) 5 MG tablet, Take 1 tablet (5 mg total) by mouth every 6 (six) hours as needed for nausea or vomiting., Disp: 30 tablet, Rfl: 0   promethazine (PHENERGAN) 25 MG tablet, Take 1 tablet (25 mg total) by mouth every 6 (six) hours as needed for nausea or vomiting., Disp: 30 tablet, Rfl: 2   Medications ordered in this encounter:  Meds ordered this encounter  Medications   meclizine (ANTIVERT) 50 MG tablet    Sig: Take 0.5-1 tablets (25-50 mg total) by mouth 2 (two) times daily as  needed.    Dispense:  30 tablet    Refill:  0    Order Specific Question:   Supervising Provider    Answer:   Merrilee Jansky [id number]     *If you need refills on other medications prior to your next appointment, please contact your pharmacy*  Follow-Up: Call back or seek an in-person evaluation if the symptoms worsen or if the condition fails to improve as anticipated.  [facility] [phone]  Other Instructions Establish with PCP for further testing and possible referrals   If you have been instructed to have an in-person evaluation today at a local Urgent Care facility, please use the link below. It will take you to a list of all of our available [facility], including address, phone number and hours of operation. Please do not delay care.  [facility]  If you or a family member do not have a primary care provider, use the link below to schedule a visit and establish care. When you choose a [facility] primary care physician or advanced practice provider, you gain a long-term partner in health. Find a Primary Care Provider  Learn more about [facility]'s in-office and virtual care options: [facility] - Get Care Now

## 2022-10-19 ENCOUNTER — Ambulatory Visit (HOSPITAL_BASED_OUTPATIENT_CLINIC_OR_DEPARTMENT_OTHER): Payer: Medicaid Other | Admitting: Family Medicine

## 2022-10-23 ENCOUNTER — Ambulatory Visit (HOSPITAL_BASED_OUTPATIENT_CLINIC_OR_DEPARTMENT_OTHER): Payer: Medicaid Other | Admitting: Family Medicine

## 2023-01-24 IMAGING — US US OB < 14 WEEKS - US OB TV
1 series · 15 of 28 positions shown · non-contrast
Comparison: None.

CLINICAL DATA: Vaginal bleeding.

EXAM:
OBSTETRIC <14 WK US AND TRANSVAGINAL OB US
TECHNIQUE: Both transabdominal and transvaginal ultrasound examinations were
performed for complete evaluation of the gestation as well as the
maternal uterus, adnexal regions, and pelvic cul-de-sac.
Transvaginal technique was performed to assess early pregnancy.

[Series 1: us ob < 14 weeks - us ob tv · 15 of 38 slices shown]
[im 1/38]
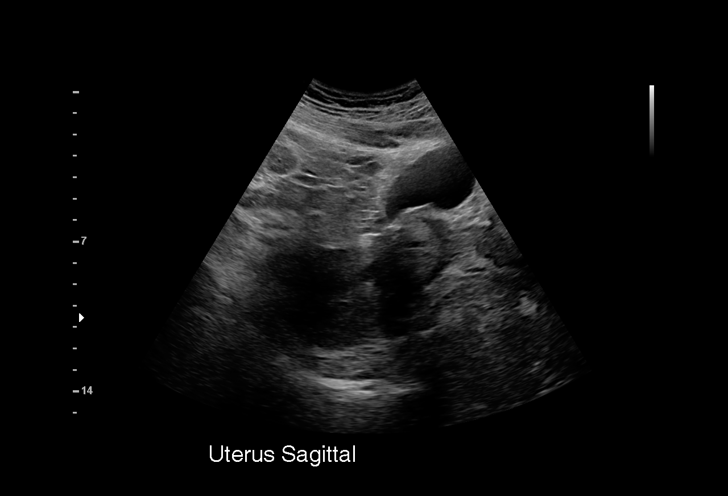
[im 3/38]
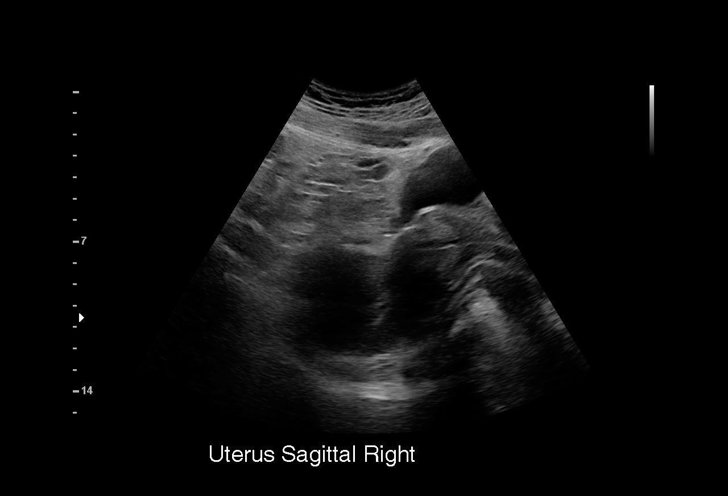
[im 6/38]
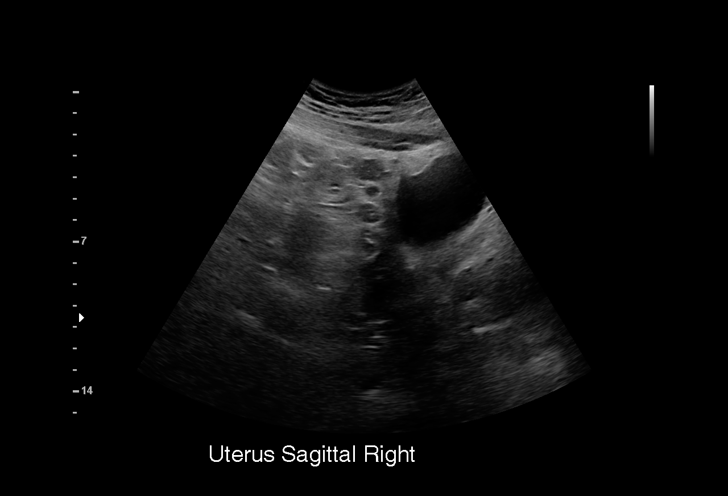
[im 9/38]
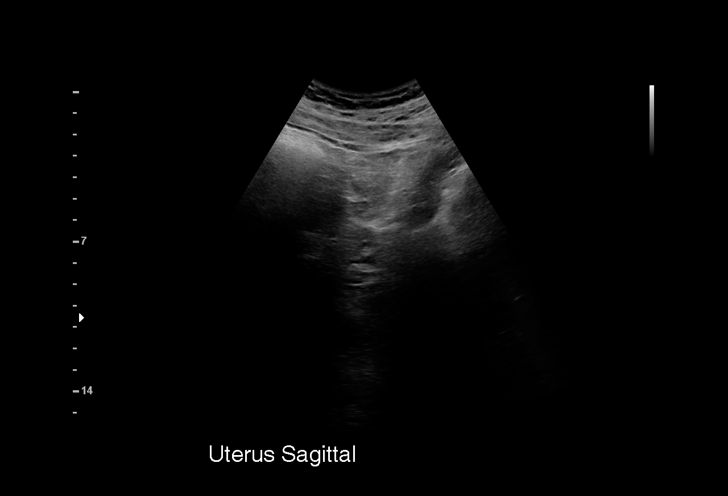
[im 11/38]
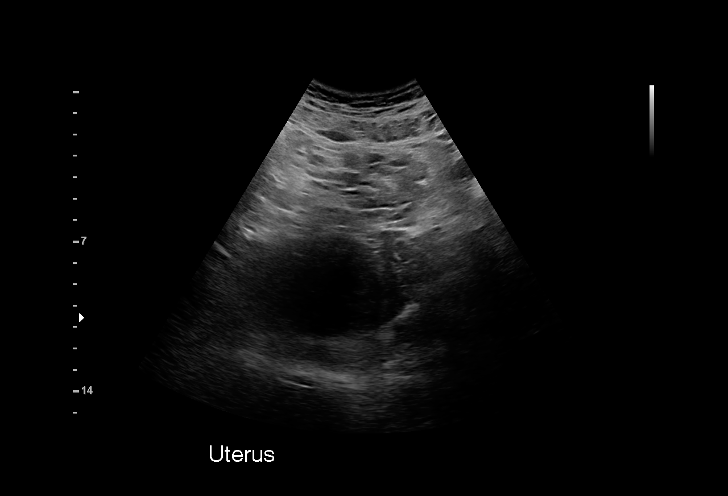
[im 14/38]
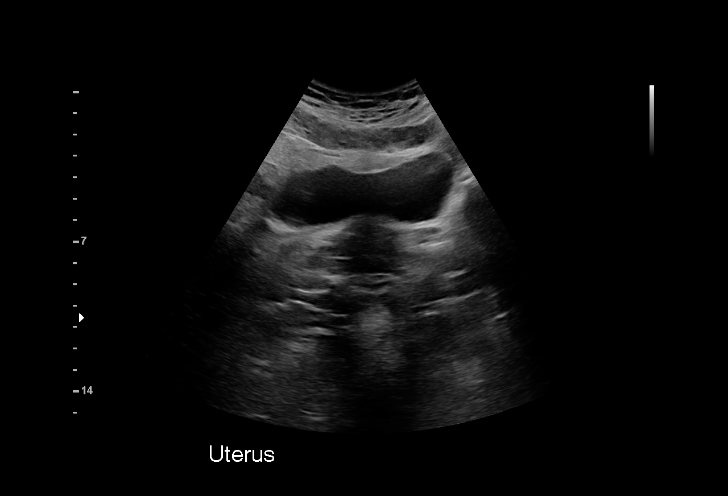
[im 17/38]
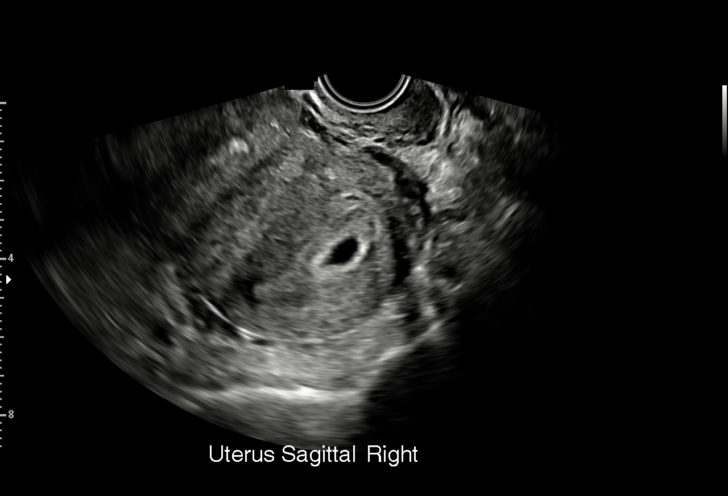
[im 20/38]
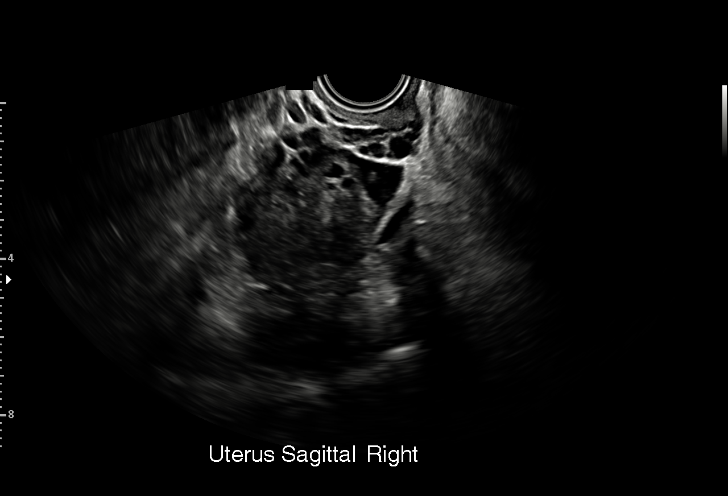
[im 21/38]
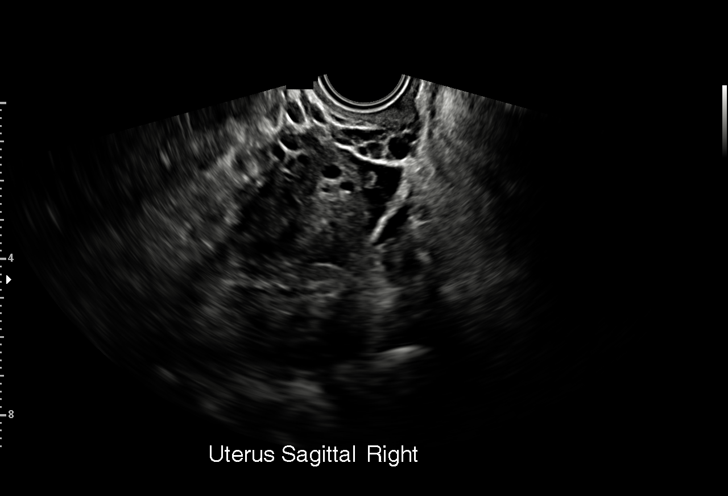
[im 24/38]
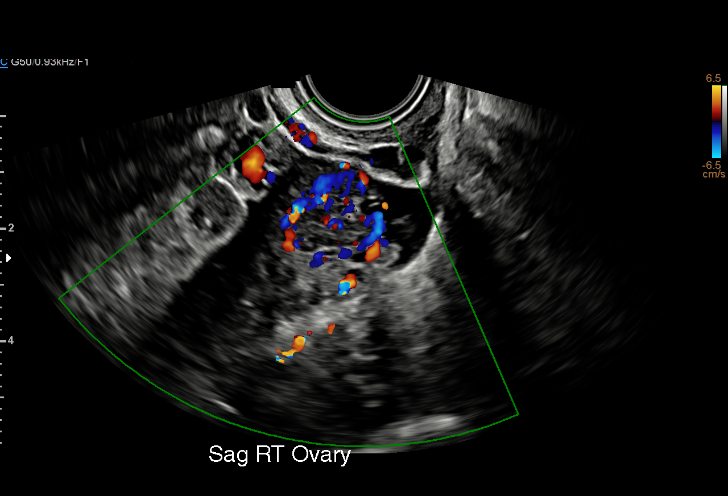
[im 27/38]
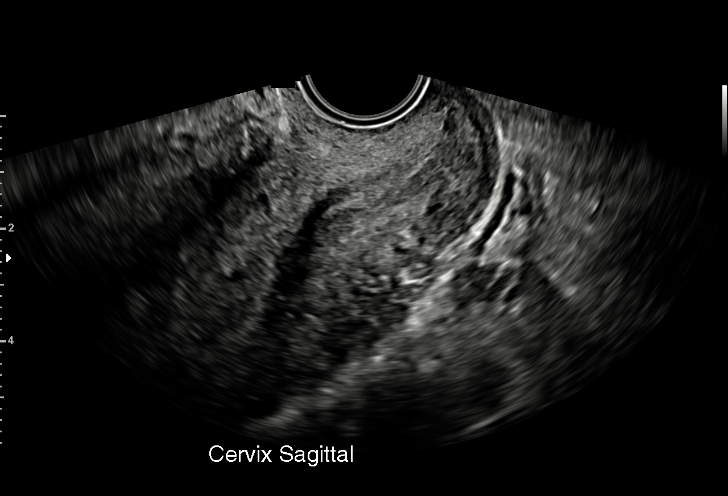
[im 29/38]
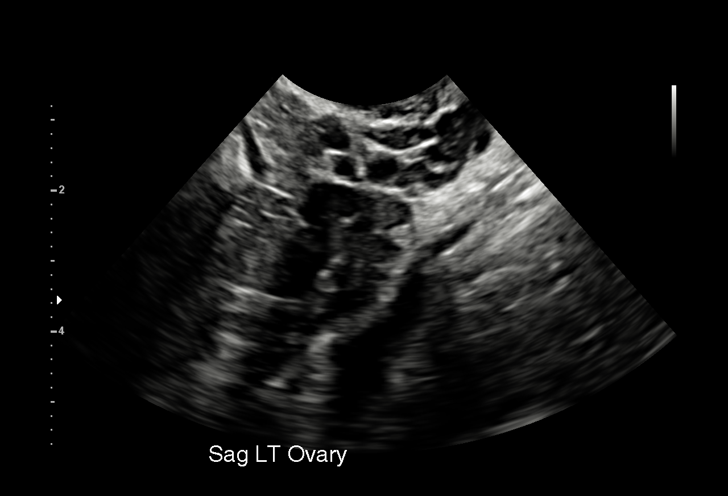
[im 32/38]
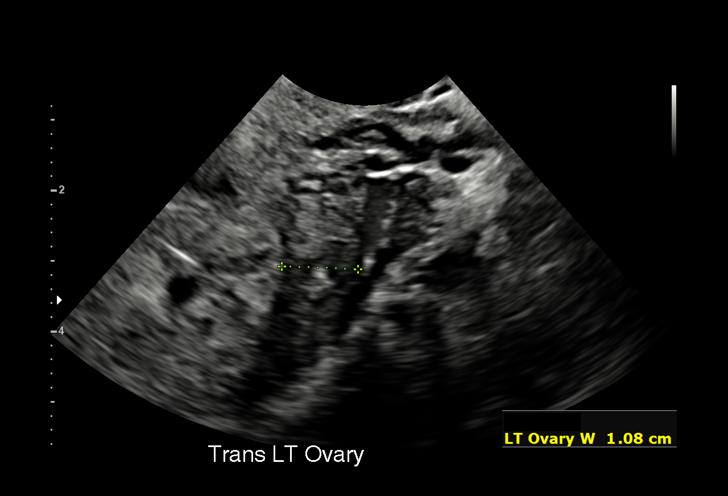
[im 35/38]
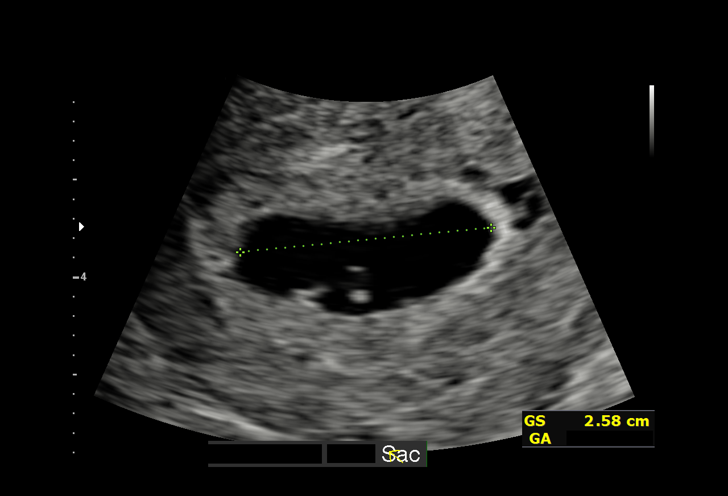
[im 38/38]
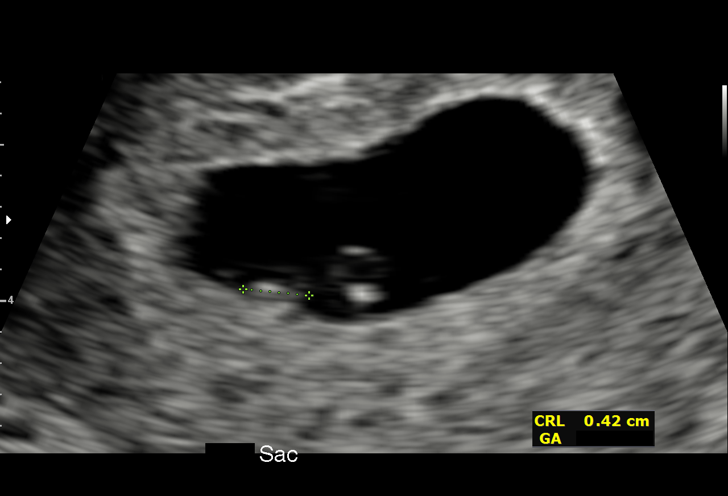

[15 of 28 positions shown; findings below may reference images not displayed]

FINDINGS: Intrauterine gestational sac: Single

Yolk sac:  Visualized.

Embryo: Indeterminate. There are 2 small echogenic areas within the
gestational sac without cardiac activity.

Cardiac Activity: Not Visualized.

MSD: 17.5 mm   6 w   4 d

Subchorionic hemorrhage:  None visualized.

Maternal uterus/adnexae: Bilateral maternal ovaries appear within
normal limits. There is no free fluid.
IMPRESSION: 1. Early gestational sac measuring 6 weeks 4 days. There are 2
echogenic areas in the gestational sac without cardiac activity. One
of these may represent an embryo. Recommend follow-up quantitative
B-HCG levels and follow-up US in 14 days to assess viability. This
recommendation follows SRU consensus guidelines: Diagnostic Criteria
for Nonviable Pregnancy Early in the First Trimester. N Engl J Med

## 2023-01-28 ENCOUNTER — Ambulatory Visit: Payer: Medicaid Other | Admitting: Physician Assistant

## 2023-01-28 NOTE — Progress Notes (Deleted)
New patient visit   Patient: Susan Decker   DOB: 04/25/1995   27 y.o. Female  MRN: [id number] Visit Date: 01/28/2023  Today's healthcare provider: Alfredia Ferguson, PA-C   No chief complaint on file.  Subjective    Susan Decker is a 27 y.o. female who presents today as a new patient to establish care.   ***  Past Medical History:  Diagnosis Date   Bronchitis    Medical history non-contributory    Past Surgical History:  Procedure Laterality Date   NO PAST SURGERIES     Family Status  Relation Name Status   Mother  Alive   Father  Alive  No partnership data on file   Family History  Problem Relation Age of Onset   Hypertension Mother    Diabetes Mother    Hypertension Father    Healthy Father    Social History   Socioeconomic History   Marital status: Married    Spouse name: Not on file   Number of children: Not on file   Years of education: Not on file   Highest education level: Not on file  Occupational History   Not on file  Tobacco Use   Smoking status: Every Day   Smokeless tobacco: Never  Vaping Use   Vaping status: Never Used  Substance and Sexual Activity   Alcohol use: Not Currently   Drug use: Not Currently    Types: Marijuana   Sexual activity: Yes    Birth control/protection: None  Other Topics Concern   Not on file  Social History Narrative   Not on file   Social Drivers of Health   Financial Resource Strain: Low Risk  (10/17/2021)   [facility] to [facility] visits prior to 03/31/2022.   Overall Financial Resource Strain (CARDIA)    Difficulty of Paying Living Expenses: Not hard at all  Food Insecurity: No Food Insecurity (10/17/2021)   [facility] prior to 03/31/2022[facility] visits prior to 03/31/2022.   Hunger Vital Sign    Worried About Running Out of  Food in the Last Year: Never true    Ran Out of Food in the Last Year: Never true  Transportation Needs: No Transportation Needs (10/17/2021)   [facility] visits prior to 03/31/2022[facility] visits prior to 03/31/2022.   PRAPARE - Administrator, Civil Service (Medical): No    Lack of Transportation (Non-Medical): No  Physical Activity: Sufficiently Active (10/17/2021)   [facility] visits prior to 03/31/2022[facility] visits prior to 03/31/2022.   Exercise Vital Sign    Days of Exercise per Week: 7 days    Minutes of Exercise per Session: 40 min  Stress: No Stress Concern Present (10/17/2021)   [facility] prior to 03/31/2022[facility] visits prior to 03/31/2022.   [employer] of Occupational Health - Occupational Stress Questionnaire    Feeling of Stress : Not at all  Social Connections: Moderately Integrated (10/17/2021)   [facility] visits prior to 03/31/2022[facility] visits prior to 03/31/2022.   Social Connection and Isolation Panel [NHANES]  Frequency of Communication with Friends and Family: More than three times a week    Frequency of Social Gatherings with Friends and Family: More than three times a week    Attends Religious Services: 1 to 4 times per year    Active Member of [employer] or Organizations: No    Attends Banker Meetings: Never    Marital Status: Married   Outpatient Medications Prior to Visit  Medication Sig   cephALEXin (KEFLEX) 500 MG capsule Take 500 mg by mouth 3 (three) times daily.   famotidine (PEPCID) 20 MG tablet Take 1 tablet (20 mg total) by mouth 2 (two) times daily.   meclizine (ANTIVERT) 50 MG tablet Take 0.5-1 tablets (25-50 mg  total) by mouth 2 (two) times daily as needed.   mometasone (NASONEX) 50 MCG/ACT nasal spray Place 2 sprays into the nose daily.   ondansetron (ZOFRAN-ODT) 4 MG disintegrating tablet Take 1 tablet (4 mg total) by mouth every 6 (six) hours as needed for up to 40 doses for nausea.   oxyCODONE-acetaminophen (PERCOCET/ROXICET) 5-325 MG tablet Take by mouth.   prochlorperazine (COMPAZINE) 5 MG tablet Take 1 tablet (5 mg total) by mouth every 6 (six) hours as needed for nausea or vomiting.   promethazine (PHENERGAN) 25 MG tablet Take 1 tablet (25 mg total) by mouth every 6 (six) hours as needed for nausea or vomiting.   No facility-administered medications prior to visit.   Allergies  Allergen Reactions   Latex Itching    Immunization History  Administered Date(s) Administered   HPV 9-valent 09/03/2007, 11/03/2007, 03/15/2008   Hepatitis A, Ped/Adol-2 Dose 11/29/2006, 09/03/2007   Hepatitis B, PED/ADOLESCENT 11/29/2006, 03/13/2007, 09/03/2007   MMR 11/29/2006, 03/13/2007   Meningococcal Conjugate 09/03/2007   Polio, Unspecified 11/29/2006, 03/13/2007, 09/03/2007   Td 11/29/2006, 03/13/2007, 09/03/2007, 03/15/2008   Tdap 03/15/2008   Varicella 11/29/2006, 03/13/2007    Health Maintenance  Topic Date Due   Hepatitis C Screening  Never done   Cervical Cancer Screening (Pap smear)  Never done   DTaP/Tdap/Td (5 - Td or Tdap) 03/15/2018   INFLUENZA VACCINE  Never done   COVID-19 Vaccine (1 - 2024-25 season) Never done   HPV VACCINES  Completed   HIV Screening  Completed    Patient Care Team: Patient, No Pcp Per as PCP - General (General Practice)  Review of Systems  {Insert previous labs (optional):23779} {See past labs  Heme  Chem  Endocrine  Serology  Results Review (optional):1}   Objective    There were no vitals taken for this visit. {Insert last BP/Wt (optional):23777}{See vitals history (optional):1}   Physical Exam ***  Depression Screen     No data to  display         No results found for any visits on 01/28/23.  Assessment & Plan     There are no diagnoses linked to this encounter.   No follow-ups on file.      Alfredia Ferguson, PA-C  [facility] Primary Care at [facility] [phone] (phone) [phone] (fax)  [facility]

## 2023-02-08 NOTE — Progress Notes (Deleted)
 New patient visit   Patient: Susan Decker   DOB: [birth year]   28 y.o. Female  MRN: [id number] Visit Date: 02/11/2023  Today's healthcare provider: Manuelita Flatness, PA-C   No chief complaint on file.  Subjective    Susan Decker is a 28 y.o. female who presents today as a new patient to establish care.   ***  Past Medical History:  Diagnosis Date   Bronchitis    Medical history non-contributory    Past Surgical History:  Procedure Laterality Date   NO PAST SURGERIES     Family Status  Relation Name Status   Mother  Alive   Father  Alive  No partnership data on file   Family History  Problem Relation Age of Onset   Hypertension Mother    Diabetes Mother    Hypertension Father    Healthy Father    Social History   Socioeconomic History   Marital status: Married    Spouse name: Not on file   Number of children: Not on file   Years of education: Not on file   Highest education level: Not on file  Occupational History   Not on file  Tobacco Use   Smoking status: Every Day   Smokeless tobacco: Never  Vaping Use   Vaping status: Never Used  Substance and Sexual Activity   Alcohol use: Not Currently   Drug use: Not Currently    Types: Marijuana   Sexual activity: Yes    Birth control/protection: None  Other Topics Concern   Not on file  Social History Narrative   Not on file   Social Drivers of Health   Financial Resource Strain: Low Risk  (10/17/2021)   [facility] prior to [facility]/31/2022[facility] visits prior to 03/31/2022.   Overall Financial Resource Strain (CARDIA)    Difficulty of Paying Living Expenses: Not hard at all  Food Insecurity: No Food Insecurity (10/17/2021)   [facility] visits prior to 03/31/2022[facility] visits prior to 03/31/2022.   Hunger Vital Sign    Worried About Running Out of  Food in the Last Year: Never true    Ran Out of Food in the Last Year: Never true  Transportation Needs: No Transportation Needs (10/17/2021)   [facility] visits prior to 03/31/2022., [facility] visits prior to 03/31/2022.   PRAPARE - Administrator, Civil Service (Medical): No    Lack of Transportation (Non-Medical): No  Physical Activity: Sufficiently Active (10/17/2021)   [facility] visits prior to 03/31/2022., [facility] visits prior to 03/31/2022.   Exercise Vital Sign    Days of Exercise per Week: 7 days    Minutes of Exercise per Session: 40 min  Stress: No Stress Concern Present (10/17/2021)   [facility] prior to 03/31/2022., [facility] visits prior to 03/31/2022.   [employer] of Occupational Health - Occupational Stress Questionnaire    Feeling of Stress : Not at all  Social Connections: Moderately Integrated (10/17/2021)   Received from [facility] visits prior to 03/31/2022., [facility] visits prior to 03/31/2022.   Social Connection and Isolation Panel [NHANES]  Frequency of Communication with Friends and Family: More than three times a week    Frequency of Social Gatherings with Friends and Family: More than three times a week    Attends Religious Services: 1 to 4 times per year    Active Member of [employer] or Organizations: No    Attends Banker Meetings: Never    Marital Status: Married   Outpatient Medications Prior to Visit  Medication Sig   cephALEXin (KEFLEX) 500 MG capsule Take 500 mg by mouth 3 (three) times daily.   famotidine  (PEPCID ) 20 MG tablet Take 1 tablet (20 mg total) by mouth 2 (two) times daily.   meclizine  (ANTIVERT ) 50 MG tablet Take 0.5-1 tablets (25-50 mg  total) by mouth 2 (two) times daily as needed.   mometasone  (NASONEX ) 50 MCG/ACT nasal spray Place 2 sprays into the nose daily.   ondansetron  (ZOFRAN -ODT) 4 MG disintegrating tablet Take 1 tablet (4 mg total) by mouth every 6 (six) hours as needed for up to 40 doses for nausea.   oxyCODONE-acetaminophen  (PERCOCET/ROXICET) 5-325 MG tablet Take by mouth.   prochlorperazine  (COMPAZINE ) 5 MG tablet Take 1 tablet (5 mg total) by mouth every 6 (six) hours as needed for nausea or vomiting.   promethazine  (PHENERGAN ) 25 MG tablet Take 1 tablet (25 mg total) by mouth every 6 (six) hours as needed for nausea or vomiting.   No facility-administered medications prior to visit.   Allergies  Allergen Reactions   Latex Itching    Immunization History  Administered Date(s) Administered   HPV 9-valent 09/03/2007, 11/03/2007, 03/15/2008   Hepatitis A, Ped/Adol-2 Dose 11/29/2006, 09/03/2007   Hepatitis B, PED/ADOLESCENT 11/29/2006, 03/13/2007, 09/03/2007   MMR 11/29/2006, 03/13/2007   Meningococcal Conjugate 09/03/2007   Polio, Unspecified 11/29/2006, 03/13/2007, 09/03/2007   Td 11/29/2006, 03/13/2007, 09/03/2007, 03/15/2008   Tdap 03/15/2008   Varicella 11/29/2006, 03/13/2007    Health Maintenance  Topic Date Due   Pneumococcal Vaccine 32-53 Years old (1 of 2 - PCV) Never done   Hepatitis C Screening  Never done   Cervical Cancer Screening (Pap smear)  Never done   DTaP/Tdap/Td (5 - Td or Tdap) 03/15/2018   INFLUENZA VACCINE  Never done   COVID-19 Vaccine (1 - 2024-25 season) Never done   HPV VACCINES  Completed   HIV Screening  Completed    Patient Care Team: Patient, No Pcp Per as PCP - General (General Practice)  Review of Systems  {Insert previous labs (optional):23779} {See past labs  Heme  Chem  Endocrine  Serology  Results Review (optional):1}   Objective    There were no vitals taken for this visit. {Insert last BP/Wt (optional):23777}{See vitals history  (optional):1}   Physical Exam ***  Depression Screen     No data to display         No results found for any visits on 02/11/23.  Assessment & Plan     There are no diagnoses linked to this encounter.   No follow-ups on file.      Manuelita Flatness, PA-C  [facility] Primary Care at [facility] [phone] (phone) [phone] (fax)  [facility]

## 2023-02-10 IMAGING — US US OB TRANSVAGINAL
1 series · 15 of 28 positions shown · non-contrast
Comparison: 02/24/2021.

CLINICAL DATA: Assess viability.

EXAM:
TRANSVAGINAL OB ULTRASOUND
TECHNIQUE: Transvaginal ultrasound was performed for complete evaluation of the
gestation as well as the maternal uterus, adnexal regions, and
pelvic cul-de-sac.

[Series 1: us ob transvaginal · 15 of 41 slices shown]
[im 1/41]
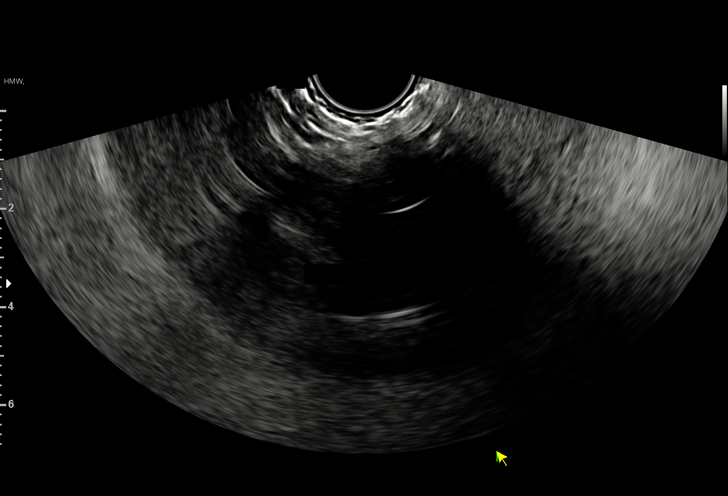
[im 3/41]
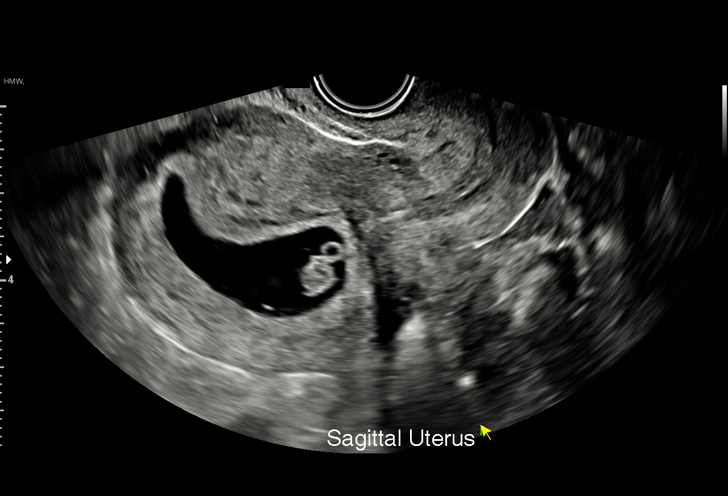
[im 6/41]
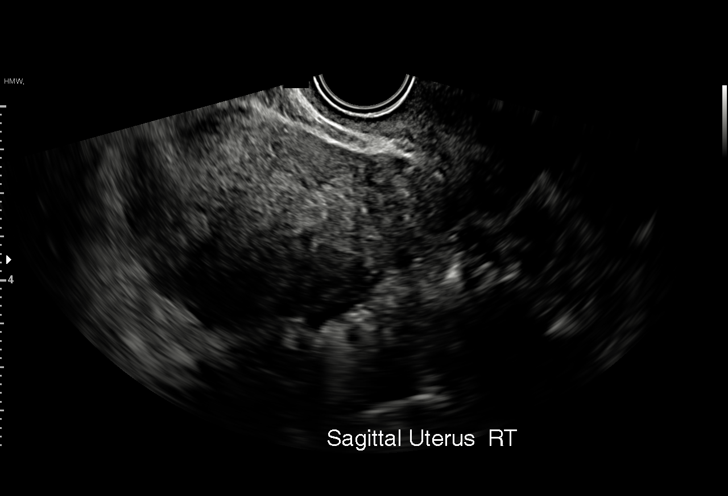
[im 9/41]
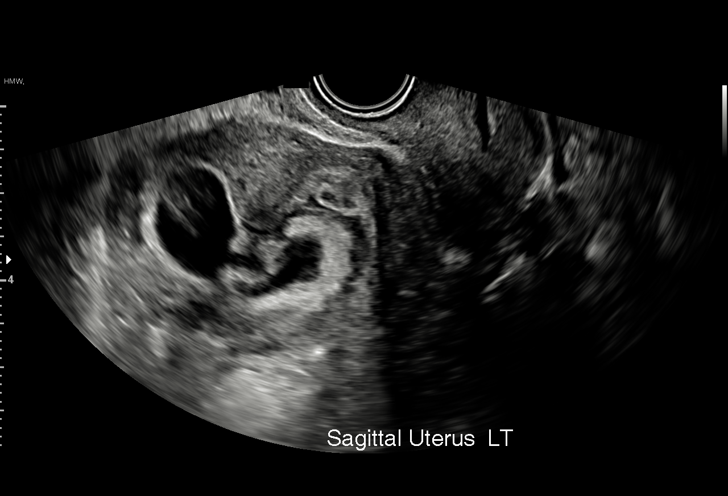
[im 12/41]
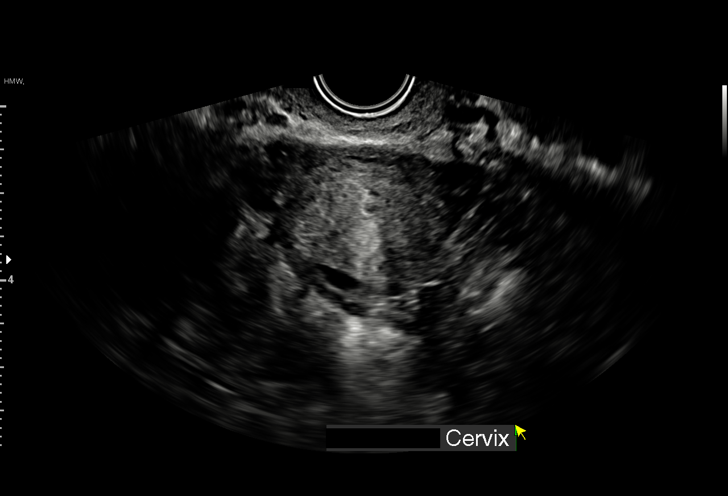
[im 15/41]
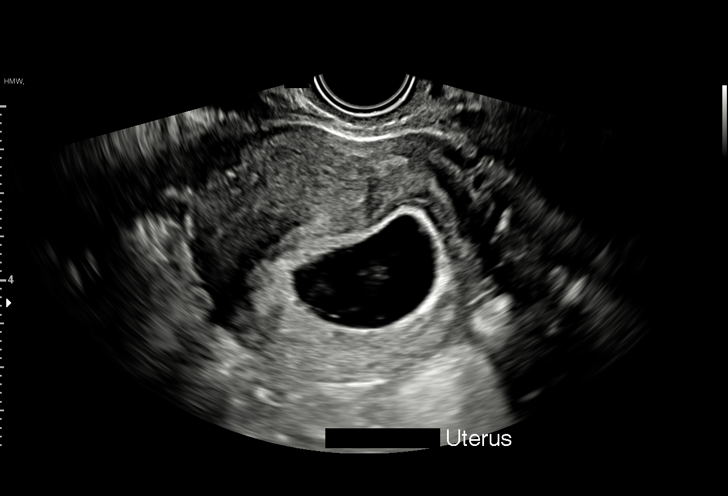
[im 18/41]
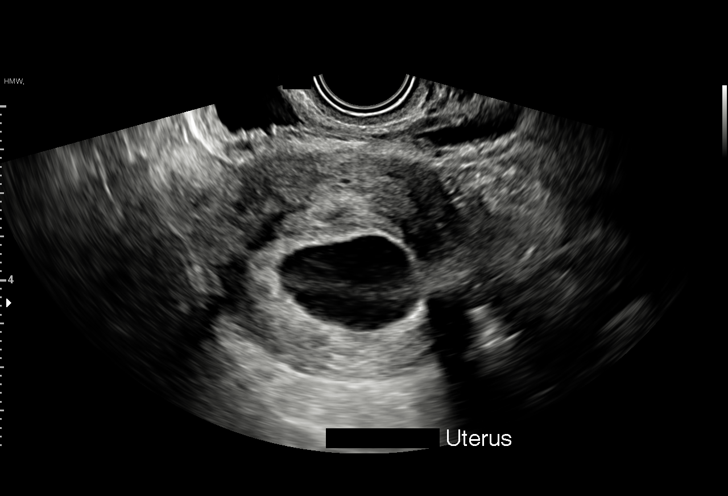
[im 21/41]
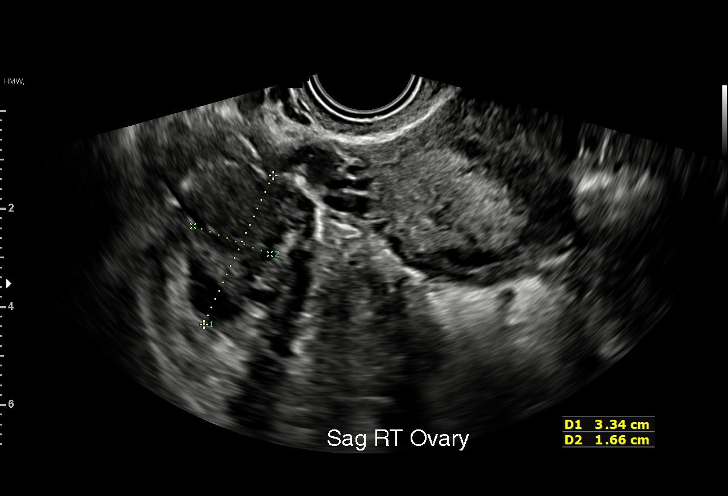
[im 23/41]
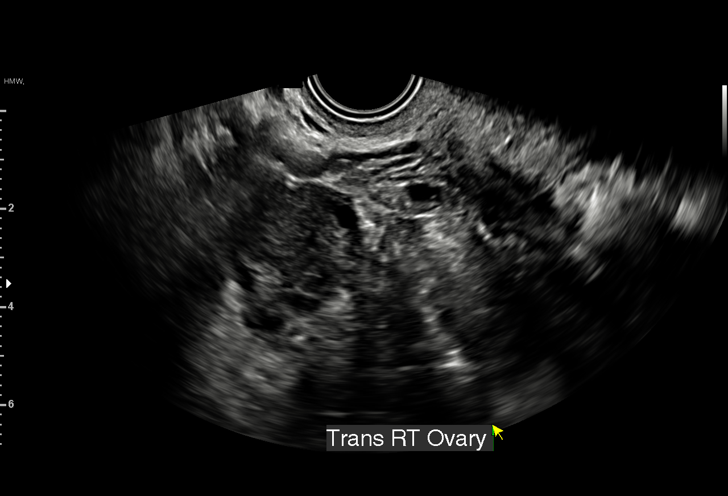
[im 26/41]
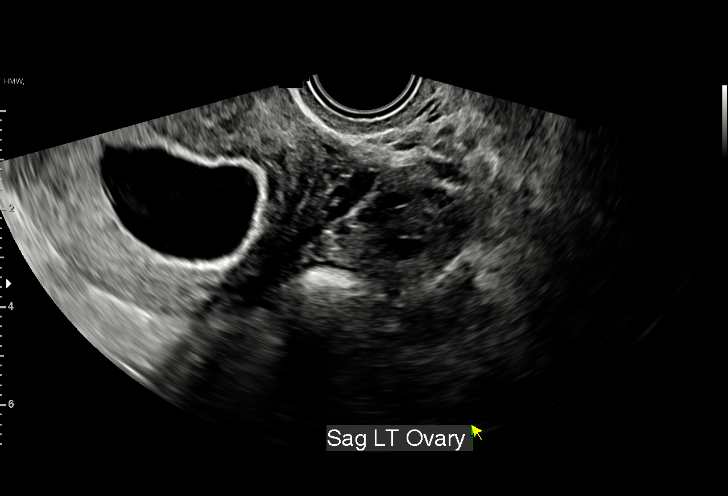
[im 29/41]
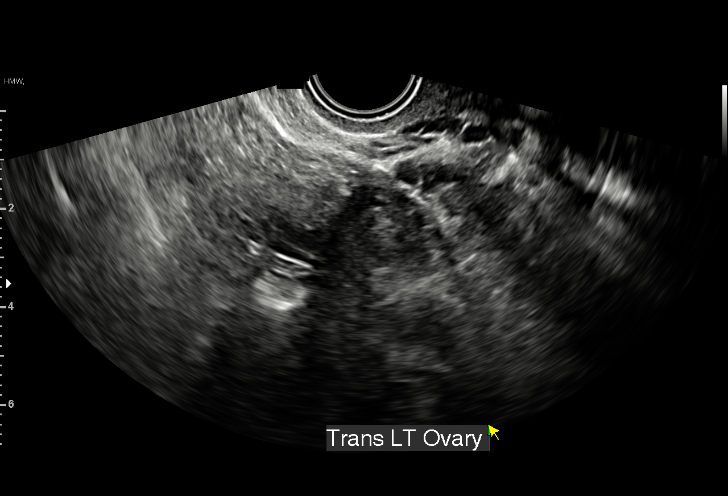
[im 32/41]
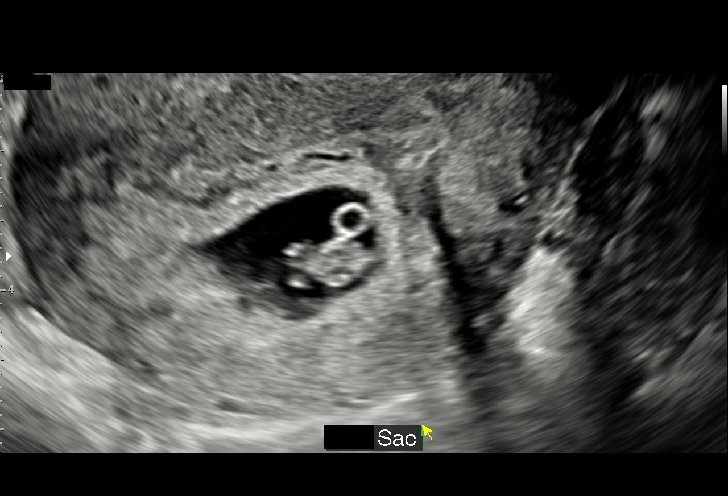
[im 35/41]
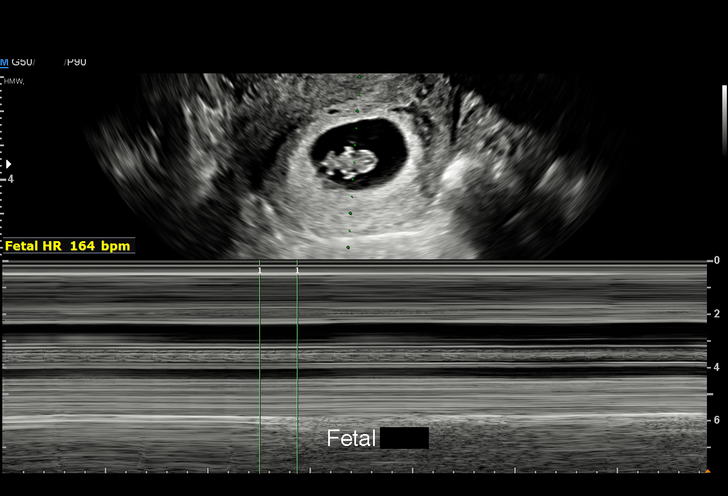
[im 38/41]
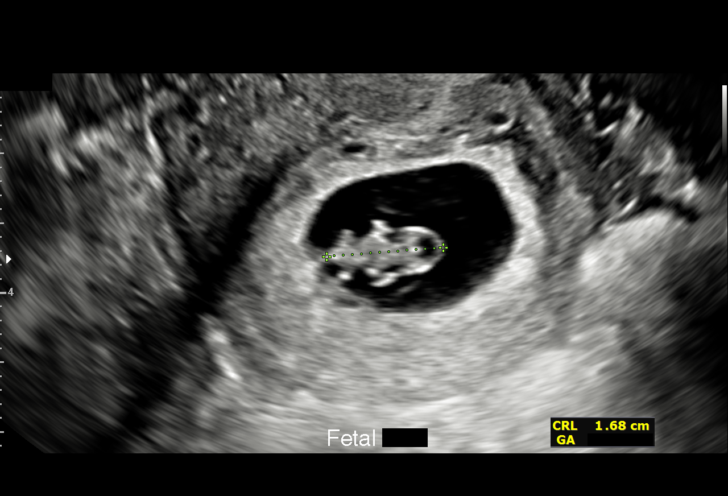
[im 41/41]
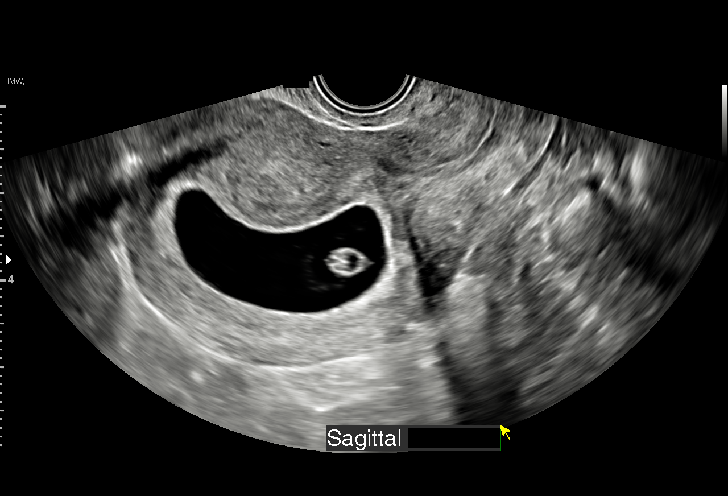

[15 of 28 positions shown; findings below may reference images not displayed]

FINDINGS: Intrauterine gestational sac: Single.

Yolk sac:  Present.

Embryo:  Present.

Cardiac Activity: Present.

Heart Rate: 164 bpm

CRL:   17.2 mm   8 w 1 d                  US EDC: 10/22/2021

Subchorionic hemorrhage:  None visualized.

Maternal uterus/adnexae: Normal ovaries.  No free fluid.
IMPRESSION: Single living intrauterine pregnancy with gestational age 8 weeks 1
day and estimated date of confinement 10/22/2021.

## 2023-02-11 ENCOUNTER — Ambulatory Visit: Payer: Medicaid Other | Admitting: Physician Assistant

## 2023-03-19 ENCOUNTER — Emergency Department (HOSPITAL_BASED_OUTPATIENT_CLINIC_OR_DEPARTMENT_OTHER)
Admission: EM | Admit: 2023-03-19 | Discharge: 2023-03-19 | Disposition: A | Discharge status: Home / Self Care | Payer: Medicaid Other | Attending: Emergency Medicine | Admitting: Emergency Medicine

## 2023-03-19 ENCOUNTER — Encounter (HOSPITAL_BASED_OUTPATIENT_CLINIC_OR_DEPARTMENT_OTHER): Payer: Self-pay | Admitting: Emergency Medicine

## 2023-03-19 ENCOUNTER — Other Ambulatory Visit: Payer: Self-pay

## 2023-03-19 DIAGNOSIS — M5441 Lumbago with sciatica, right side: Secondary | ICD-10-CM | POA: Diagnosis not present

## 2023-03-19 DIAGNOSIS — M549 Dorsalgia, unspecified: Secondary | ICD-10-CM | POA: Diagnosis present

## 2023-03-19 DIAGNOSIS — Z79899 Other long term (current) drug therapy: Secondary | ICD-10-CM | POA: Insufficient documentation

## 2023-03-19 MED ORDER — CYCLOBENZAPRINE HCL 10 MG PO TABS: 10.0000 mg | ORAL_TABLET | Freq: Two times a day (BID) | ORAL | 0 refills | Status: AC | PRN

## 2023-03-19 MED ORDER — PREDNISONE 20 MG PO TABS: 40.0000 mg | ORAL_TABLET | Freq: Every day | ORAL | 0 refills | Status: AC

## 2023-03-19 NOTE — Discharge Instructions (Signed)
You have been seen today for your complaint of low back pain. Your discharge medications include Flexeril. This is a muscle relaxer. It may cause drowsiness. Do not drive, operate heavy machinery or make important decisions when taking this medication. Only take it at night until you know how it affects you. Only take it as needed and take other medications such as ibuprofen or tylenol prior to trying this medication.   Prednisone.  This is a steroid.  Take it as prescribed and for the entire duration of the prescription. Follow up with: Your primary care provider in 1 week for reevaluation Please seek immediate medical care if you develop any of the following symptoms: You are not able to control when you urinate or have bowel movements (incontinence). You have: Weakness in your lower back, pelvis, buttocks, or legs that gets worse. Redness or swelling of your back. A burning sensation when you urinate. At this time there does not appear to be the presence of an emergent medical condition, however there is always the potential for conditions to change. Please read and follow the below instructions.  Do not take your medicine if  develop an itchy rash, swelling in your mouth or lips, or difficulty breathing; call 911 and seek immediate emergency medical attention if this occurs.  You may review your lab tests and imaging results in their entirety on your MyChart account.  Please discuss all results of fully with your primary care provider and other specialist at your follow-up visit.  Note: Portions of this text may have been transcribed using voice recognition software. Every effort was made to ensure accuracy; however, inadvertent computerized transcription errors may still be present.

## 2023-03-19 NOTE — ED Provider Notes (Signed)
[place] EMERGENCY DEPARTMENT AT [facility] Provider Note   CSN: [id number] Arrival date & time: 03/19/23  0827     History  Chief Complaint  Patient presents with   Back Pain    Susan Decker is a 28 y.o. female.  Presenting to the ED for evaluation of right-sided low back pain.  Symptoms began on 03/07/2023.  States she was at the park with her toddler that day and was lifting her quite a bit.  States that the symptoms have progressively worsened.  Pain radiates down the right leg to the knee.  States her leg feels heavy.  She denies any numbness, weakness or tingling.  No saddle anesthesia.  No urinary or fecal incontinence.  No fevers or chills.  No trauma.  She has been taking Tylenol and Aleve with some improvement.  Pain is worse with forward flexion.   Back Pain      Home Medications Prior to Admission medications   Medication Sig Start Date End Date Taking? Authorizing Provider  cyclobenzaprine (FLEXERIL) 10 MG tablet Take 1 tablet (10 mg total) by mouth 2 (two) times daily as needed for up to 7 days. 03/19/23 03/26/23 Yes Sharniece Gibbon, Edsel Petrin, PA-C  predniSONE (DELTASONE) 20 MG tablet Take 2 tablets (40 mg total) by mouth daily for 4 days. 03/19/23 03/23/23 Yes Nadezhda Pollitt, Edsel Petrin, PA-C  cephALEXin (KEFLEX) 500 MG capsule Take 500 mg by mouth 3 (three) times daily. 10/25/21   [provider]  famotidine (PEPCID) 20 MG tablet Take 1 tablet (20 mg total) by mouth 2 (two) times daily. 10/31/21   Ellsworth Lennox, PA-C  meclizine (ANTIVERT) 50 MG tablet Take 0.5-1 tablets (25-50 mg total) by mouth 2 (two) times daily as needed. 10/14/22   Claiborne Rigg, NP  mometasone (NASONEX) 50 MCG/ACT nasal spray Place 2 sprays into the nose daily. 10/08/22   Palumbo, April, MD  ondansetron (ZOFRAN-ODT) 4 MG disintegrating tablet Take 1 tablet (4 mg total) by mouth every 6 (six) hours as needed for up to 40 doses for nausea. 10/31/21   Ellsworth Lennox, PA-C  oxyCODONE-acetaminophen  (PERCOCET/ROXICET) 5-325 MG tablet Take by mouth. 10/25/21   [provider]  prochlorperazine (COMPAZINE) 5 MG tablet Take 1 tablet (5 mg total) by mouth every 6 (six) hours as needed for nausea or vomiting. 03/04/21   Calvert Cantor, CNM  promethazine (PHENERGAN) 25 MG tablet Take 1 tablet (25 mg total) by mouth every 6 (six) hours as needed for nausea or vomiting. 02/27/21   Aviva Signs, CNM      Allergies    Latex    Review of Systems   Review of Systems  Musculoskeletal:  Positive for back pain.  All other systems reviewed and are negative.   Physical Exam Updated Vital Signs BP 129/85 (BP Location: Left Arm)   Pulse 72   Temp 98.2 F (36.8 C) (Oral)   Resp 20   Ht 5\' 4"  (1.626 m)   Wt 88.5 kg   LMP 02/26/2023 (Exact Date)   SpO2 99%   BMI 33.47 kg/m  Physical Exam Vitals and nursing note reviewed.  Constitutional:      General: She is not in acute distress.    Appearance: Normal appearance. She is normal weight. She is not ill-appearing.  HENT:     Head: Normocephalic and atraumatic.  Pulmonary:     Effort: Pulmonary effort is normal. No respiratory distress.  Abdominal:     General: Abdomen is flat.  Musculoskeletal:        General: Normal range of motion.     Cervical back: Neck supple.     Comments: Straight leg raise negative on the left, positive on the right.  Sensation intact distally.  Hip strength 5 out of 5 bilaterally.  Mild TTP to right lumbar back.  Skin:    General: Skin is warm and dry.  Neurological:     Mental Status: She is alert and oriented to person, place, and time.  Psychiatric:        Mood and Affect: Mood normal.        Behavior: Behavior normal.     ED Results / Procedures / Treatments   Labs (all labs ordered are listed, but only abnormal results are displayed) Labs Reviewed - No data to display  EKG None  Radiology No results found.  Procedures Procedures    Medications Ordered in ED Medications -  No data to display  ED Course/ Medical Decision Making/ A&P                                 Medical Decision Making This patient presents to the ED for concern of back pain, this involves an extensive number of treatment options, and is a complaint that carries with it a high risk of complications and morbidity.  The emergent differential diagnosis for back pain includes but is not limited to fracture, muscle strain, cauda equina, spinal stenosis. DDD, ankylosing spondylitis, acute ligamentous injury, disk herniation, spondylolisthesis, Epidural compression syndrome, metastatic cancer, transverse myelitis, vertebral osteomyelitis, diskitis, kidney stone, pyelonephritis, AAA, Perforated ulcer, Retrocecal appendicitis, pancreatitis, bowel obstruction, retroperitoneal hemorrhage or mass, meningitis.   Additional history obtained from: Nursing notes from this visit.  Afebrile, hemodynamically stable.  28 year old female presenting to the ED for evaluation of right-sided low back pain.  Symptoms began on 03/07/2023 and have progressively worsened.  She appears well on physical exam.  Straight leg raise positive on the right.  Neurovascular status intact.  Compartments are soft.  No red flag signs or symptoms to suggest cord compression syndrome.  No signs or symptoms of DVT.  Overall suspect lumbar radiculopathy versus sciatica.  Will treat with steroid burst and Flexeril.  She was educated on potential side effects.  She was encouraged to follow-up with her primary care provider.  She was given return precautions.  Stable at discharge.  At this time there does not appear to be any evidence of an acute emergency medical condition and the patient appears stable for discharge with appropriate outpatient follow up. Diagnosis was discussed with patient who verbalizes understanding of care plan and is agreeable to discharge. I have discussed return precautions with patient who verbalizes understanding. Patient  encouraged to follow-up with their PCP within 1 week. All questions answered.  Note: Portions of this report may have been transcribed using voice recognition software. Every effort was made to ensure accuracy; however, inadvertent computerized transcription errors may still be present.        Final Clinical Impression(s) / ED Diagnoses Final diagnoses:  Acute right-sided low back pain with right-sided sciatica    Rx / DC Orders ED Discharge Orders          Ordered    cyclobenzaprine (FLEXERIL) 10 MG tablet  2 times daily PRN        03/19/23 0930    predniSONE (DELTASONE) 20 MG tablet  Daily  03/19/23 0930              Michelle Piper, PA-C 03/19/23 0931    Rondel Baton, MD 03/19/23 2024

## 2023-03-19 NOTE — ED Triage Notes (Signed)
Pt arrived POV from home, caox4, ambulatory, NAD c/o R lower back pain radiating down R hip and R leg . Pt reports pain started 2/6. Pt denies any MVC, falls, trauma. Approx 4 days ago R leg began to feel "heavier" and weaker than the L. Last took Tylenol at 0400.

## 2023-05-01 ENCOUNTER — Telehealth: Admitting: Physician Assistant

## 2023-05-01 DIAGNOSIS — M545 Low back pain, unspecified: Secondary | ICD-10-CM | POA: Diagnosis not present

## 2023-05-01 MED ORDER — CYCLOBENZAPRINE HCL 10 MG PO TABS: 10.0000 mg | ORAL_TABLET | Freq: Every day | ORAL | 0 refills | Status: AC

## 2023-05-01 MED ORDER — PREDNISONE 10 MG (21) PO TBPK: ORAL_TABLET | ORAL | 0 refills | Status: AC

## 2023-05-01 NOTE — Patient Instructions (Signed)
 Susan Decker, thank you for joining Piedad Climes, PA-C for today's virtual visit.  While this provider is not your primary care provider (PCP), if your PCP is located in our provider database this encounter information will be shared with them immediately following your visit.   A [facility] MyChart account gives you access to today's visit and all your visits, tests, and labs performed at [facility] " click here if you don't have a [facility] MyChart account or go to [email or web address]  Consent: (Patient) Susan Decker provided verbal consent for this virtual visit at the beginning of the encounter.  Current Medications:  Current Outpatient Medications:    cephALEXin (KEFLEX) 500 MG capsule, Take 500 mg by mouth 3 (three) times daily., Disp: , Rfl:    famotidine (PEPCID) 20 MG tablet, Take 1 tablet (20 mg total) by mouth 2 (two) times daily., Disp: 30 tablet, Rfl: 0   meclizine (ANTIVERT) 50 MG tablet, Take 0.5-1 tablets (25-50 mg total) by mouth 2 (two) times daily as needed., Disp: 30 tablet, Rfl: 0   mometasone (NASONEX) 50 MCG/ACT nasal spray, Place 2 sprays into the nose daily., Disp: 1 each, Rfl: 0   ondansetron (ZOFRAN-ODT) 4 MG disintegrating tablet, Take 1 tablet (4 mg total) by mouth every 6 (six) hours as needed for up to 40 doses for nausea., Disp: 20 tablet, Rfl: 1   oxyCODONE-acetaminophen (PERCOCET/ROXICET) 5-325 MG tablet, Take by mouth., Disp: , Rfl:    prochlorperazine (COMPAZINE) 5 MG tablet, Take 1 tablet (5 mg total) by mouth every 6 (six) hours as needed for nausea or vomiting., Disp: 30 tablet, Rfl: 0   promethazine (PHENERGAN) 25 MG tablet, Take 1 tablet (25 mg total) by mouth every 6 (six) hours as needed for nausea or vomiting., Disp: 30 tablet, Rfl: 2   Medications ordered in this encounter:  No orders of the defined types were placed in this encounter.    *If you need refills on other medications prior to your next appointment, please  contact your pharmacy*  Follow-Up: Call back or seek an in-person evaluation if the symptoms worsen or if the condition fails to improve as anticipated.  [facility] [phone]  Other Instructions Please avoid heavy lifting and overexertion. You can apply a heating pad to the area for 10-15 minutes, a few times per day.  Take the prescribed medications as directed. Once improved, start the recommended exercises below. If not resolving, or any new/worsening symptoms, or if you have another recurrence of this, you need re-evaluation in person.   Low Back Sprain or Strain Rehab Ask your health care provider which exercises are safe for you. Do exercises exactly as told by your health care provider and adjust them as directed. It is normal to feel mild stretching, pulling, tightness, or discomfort as you do these exercises. Stop right away if you feel sudden pain or your pain gets worse. Do not begin these exercises until told by your health care provider. Stretching and range-of-motion exercises These exercises warm up your muscles and joints and improve the movement and flexibility of your back. These exercises also help to relieve pain, numbness, and tingling. Lumbar rotation  Lie on your back on a firm bed or the floor with your knees bent. Straighten your arms out to your sides so each arm forms a 90-degree angle (right angle) with a side of your body. Slowly move (rotate) both of your knees to one side of your body until you feel  a stretch in your lower back (lumbar). Try not to let your shoulders lift off the floor. Hold this position for __________ seconds. Tense your abdominal muscles and slowly move your knees back to the starting position. Repeat this exercise on the other side of your body. Repeat __________ times. Complete this exercise __________ times a day. Single knee to chest  Lie on your back on a firm bed or the floor with both legs straight. Bend one of  your knees. Use your hands to move your knee up toward your chest until you feel a gentle stretch in your lower back and buttock. Hold your leg in this position by holding on to the front of your knee. Keep your other leg as straight as possible. Hold this position for __________ seconds. Slowly return to the starting position. Repeat with your other leg. Repeat __________ times. Complete this exercise __________ times a day. Prone extension on elbows  Lie on your abdomen on a firm bed or the floor (prone position). Prop yourself up on your elbows. Use your arms to help lift your chest up until you feel a gentle stretch in your abdomen and your lower back. This will place some of your body weight on your elbows. If this is uncomfortable, try stacking pillows under your chest. Your hips should stay down, against the surface that you are lying on. Keep your hip and back muscles relaxed. Hold this position for __________ seconds. Slowly relax your upper body and return to the starting position. Repeat __________ times. Complete this exercise __________ times a day. Strengthening exercises These exercises build strength and endurance in your back. Endurance is the ability to use your muscles for a long time, even after they get tired. Pelvic tilt This exercise strengthens the muscles that lie deep in the abdomen. Lie on your back on a firm bed or the floor with your legs extended. Bend your knees so they are pointing toward the ceiling and your feet are flat on the floor. Tighten your lower abdominal muscles to press your lower back against the floor. This motion will tilt your pelvis so your tailbone points up toward the ceiling instead of pointing to your feet or the floor. To help with this exercise, you may place a small towel under your lower back and try to push your back into the towel. Hold this position for __________ seconds. Let your muscles relax completely before you repeat this  exercise. Repeat __________ times. Complete this exercise __________ times a day. Alternating arm and leg raises  Get on your hands and knees on a firm surface. If you are on a hard floor, you may want to use padding, such as an exercise mat, to cushion your knees. Line up your arms and legs. Your hands should be directly below your shoulders, and your knees should be directly below your hips. Lift your left leg behind you. At the same time, raise your right arm and straighten it in front of you. Do not lift your leg higher than your hip. Do not lift your arm higher than your shoulder. Keep your abdominal and back muscles tight. Keep your hips facing the ground. Do not arch your back. Keep your balance carefully, and do not hold your breath. Hold this position for __________ seconds. Slowly return to the starting position. Repeat with your right leg and your left arm. Repeat __________ times. Complete this exercise __________ times a day. Abdominal set with straight leg raise  Lie on your  back on a firm bed or the floor. Bend one of your knees and keep your other leg straight. Tense your abdominal muscles and lift your straight leg up, 4-6 inches (10-15 cm) off the ground. Keep your abdominal muscles tight and hold this position for __________ seconds. Do not hold your breath. Do not arch your back. Keep it flat against the ground. Keep your abdominal muscles tense as you slowly lower your leg back to the starting position. Repeat with your other leg. Repeat __________ times. Complete this exercise __________ times a day. Single leg lower with bent knees Lie on your back on a firm bed or the floor. Tense your abdominal muscles and lift your feet off the floor, one foot at a time, so your knees and hips are bent in 90-degree angles (right angles). Your knees should be over your hips and your lower legs should be parallel to the floor. Keeping your abdominal muscles tense and your knee  bent, slowly lower one of your legs so your toe touches the ground. Lift your leg back up to return to the starting position. Do not hold your breath. Do not let your back arch. Keep your back flat against the ground. Repeat with your other leg. Repeat __________ times. Complete this exercise __________ times a day. Posture and body mechanics Good posture and healthy body mechanics can help to relieve stress in your body's tissues and joints. Body mechanics refers to the movements and positions of your body while you do your daily activities. Posture is part of body mechanics. Good posture means: Your spine is in its natural S-curve position (neutral). Your shoulders are pulled back slightly. Your head is not tipped forward (neutral). Follow these guidelines to improve your posture and body mechanics in your everyday activities. Standing  When standing, keep your spine neutral and your feet about hip-width apart. Keep a slight bend in your knees. Your ears, shoulders, and hips should line up. When you do a task in which you stand in one place for a long time, place one foot up on a stable object that is 2-4 inches (5-10 cm) high, such as a footstool. This helps keep your spine neutral. Sitting  When sitting, keep your spine neutral and keep your feet flat on the floor. Use a footrest, if necessary, and keep your thighs parallel to the floor. Avoid rounding your shoulders, and avoid tilting your head forward. When working at a desk or a computer, keep your desk at a height where your hands are slightly lower than your elbows. Slide your chair under your desk so you are close enough to maintain good posture. When working at a computer, place your monitor at a height where you are looking straight ahead and you do not have to tilt your head forward or downward to look at the screen. Resting When lying down and resting, avoid positions that are most painful for you. If you have pain with  activities such as sitting, bending, stooping, or squatting, lie in a position in which your body does not bend very much. For example, avoid curling up on your side with your arms and knees near your chest (fetal position). If you have pain with activities such as standing for a long time or reaching with your arms, lie with your spine in a neutral position and bend your knees slightly. Try the following positions: Lying on your side with a pillow between your knees. Lying on your back with a pillow under your  knees. Lifting  When lifting objects, keep your feet at least shoulder-width apart and tighten your abdominal muscles. Bend your knees and hips and keep your spine neutral. It is important to lift using the strength of your legs, not your back. Do not lock your knees straight out. Always ask for help to lift heavy or awkward objects. This information is not intended to replace advice given to you by your health care provider. Make sure you discuss any questions you have with your health care provider. Document Revised: 05/21/2022 Document Reviewed: 04/04/2020 Elsevier Patient Education  2024 Elsevier Inc.   If you have been instructed to have an in-person evaluation today at a local Urgent Care facility, please use the link below. It will take you to a list of all of our available [facility], including address, phone number and hours of operation. Please do not delay care.  [facility]  If you or a family member do not have a primary care provider, use the link below to schedule a visit and establish care. When you choose a [facility] primary care physician or advanced practice provider, you gain a long-term partner in health. Find a Primary Care Provider  Check with the [facility] offices. They have a lot of great providers. I am not sure if currently taking new patients, but Jarold Motto or Dr. Durene Cal at the [facility] office are great options.   Learn  more about [facility] in-office and virtual care options:  - Get Care Now

## 2023-05-01 NOTE — Progress Notes (Signed)
 Virtual Visit Consent   Susan Decker, you are scheduled for a virtual visit with a [facility] provider today. Just as with appointments in the office, your consent must be obtained to participate. Your consent will be active for this visit and any virtual visit you may have with one of our providers in the next 365 days. If you have a MyChart account, a copy of this consent can be sent to you electronically.  As this is a virtual visit, video technology does not allow for your provider to perform a traditional examination. This may limit your provider's ability to fully assess your condition. If your provider identifies any concerns that need to be evaluated in person or the need to arrange testing (such as labs, EKG, etc.), we will make arrangements to do so. Although advances in technology are sophisticated, we cannot ensure that it will always work on either your end or our end. If the connection with a video visit is poor, the visit may have to be switched to a telephone visit. With either a video or telephone visit, we are not always able to ensure that we have a secure connection.  By engaging in this virtual visit, you consent to the provision of healthcare and authorize for your insurance to be billed (if applicable) for the services provided during this visit. Depending on your insurance coverage, you may receive a charge related to this service.  I need to obtain your verbal consent now. Are you willing to proceed with your visit today? Susan Decker has provided verbal consent on 05/01/2023 for a virtual visit (video or telephone). Piedad Climes, [place]  Date: 05/01/2023 5:43 PM   Virtual Visit via Video Note   I, Piedad Climes, connected with  Susan Decker  ([id number], 1995/12/17) on 05/01/23 at  5:45 PM EDT by a video-enabled telemedicine application and verified that I am speaking with the correct person using two identifiers.  Location: Patient: Virtual Visit Location Patient:  Home Provider: Virtual Visit Location Provider: Home Office   I discussed the limitations of evaluation and management by telemedicine and the availability of in person appointments. The patient expressed understanding and agreed to proceed.    History of Present Illness: Susan Decker is a 28 y.o. who identifies as a female who was assigned female at birth, and is being seen today for flare up of right-sided low back pain over the past few days. First incident of this was 6 weeks ago after heavy lifting. Was evaluated at [facility] ER and diagnosed with sciatica and started on muscle relaxant and a steroid. Notes symptoms fully resolved with this. No issue with this until this past Saturday. Denies known trauma or injury. Does lift her toddler often though. Notes pain is right lower back and described as a pulling sensation, worse with bending and positional change. Currently without radiation into lower extremity. Denies numbness, tingling or weakness. Denies saddle anesthesia. Has been taking Tylenol OTC without substantial relief. Has to avoid NSAIDs due to significant GI upset.    No breastfeeding. Denies concerns for pregnancy.     HPI: HPI  Problems: There are no active problems to display for this patient.   Allergies:  Allergies  Allergen Reactions   Latex Itching   Medications:  Current Outpatient Medications:    cyclobenzaprine (FLEXERIL) 10 MG tablet, Take 1 tablet (10 mg total) by mouth at bedtime., Disp: 15 tablet, Rfl: 0   predniSONE (STERAPRED UNI-PAK 21 TAB) 10 MG (21) TBPK tablet,  Take following package directions, Disp: 21 tablet, Rfl: 0  Observations/Objective: Patient is well-developed, well-nourished in no acute distress.  Resting comfortably at home.  Head is normocephalic, atraumatic.  No labored breathing. Speech is clear and coherent with logical content.  Patient is alert and oriented at baseline.   Assessment and Plan: 1. Lumbosacral pain (Primary) - predniSONE  (STERAPRED UNI-PAK 21 TAB) 10 MG (21) TBPK tablet; Take following package directions  Dispense: 21 tablet; Refill: 0 - cyclobenzaprine (FLEXERIL) 10 MG tablet; Take 1 tablet (10 mg total) by mouth at bedtime.  Dispense: 15 tablet; Refill: 0  Lumbosacral strain with spasm. Atraumatic. No alarm signs or symptoms. Supportive measures and OTC medications reviewed. Start Sterapred and Cyclobenzaprine per orders. Rehabilitation exercises discussed and handout sent in AVS. If not resolving or if having another recurrence in near future, will need further evaluation in person.  Follow Up Instructions: I discussed the assessment and treatment plan with the patient. The patient was provided an opportunity to ask questions and all were answered. The patient agreed with the plan and demonstrated an understanding of the instructions.  A copy of instructions were sent to the patient via MyChart unless otherwise noted below.   The patient was advised to call back or seek an in-person evaluation if the symptoms worsen or if the condition fails to improve as anticipated.    Piedad Climes, PA-C

## 2023-06-03 ENCOUNTER — Other Ambulatory Visit: Payer: Self-pay

## 2023-06-03 ENCOUNTER — Encounter (HOSPITAL_BASED_OUTPATIENT_CLINIC_OR_DEPARTMENT_OTHER): Payer: Self-pay | Admitting: *Deleted

## 2023-06-03 ENCOUNTER — Emergency Department (HOSPITAL_BASED_OUTPATIENT_CLINIC_OR_DEPARTMENT_OTHER)
Admission: EM | Admit: 2023-06-03 | Discharge: 2023-06-04 | Disposition: A | Discharge status: Home / Self Care | Attending: Emergency Medicine | Admitting: Emergency Medicine

## 2023-06-03 DIAGNOSIS — R21 Rash and other nonspecific skin eruption: Secondary | ICD-10-CM | POA: Insufficient documentation

## 2023-06-03 DIAGNOSIS — Z91040 Latex allergy status: Secondary | ICD-10-CM | POA: Diagnosis not present

## 2023-06-03 NOTE — ED Triage Notes (Signed)
 Pt arrived with rash to extremities. Reports eating mac and cheese from  [employer] earlier today that has bacon in it. Pt doesn't eat pork products. No other known allergies. Used hydrocortisone cream without improvement

## 2023-06-04 NOTE — ED Provider Notes (Signed)
 [facility] EMERGENCY DEPARTMENT AT [facility] Provider Note   CSN: [id number] Arrival date & time: 06/03/23  2254     History  Chief Complaint  Patient presents with   Rash    Susan Decker is a 28 y.o. female.  Patient does not eat pork is never appropriate for.  Today she was at [employer] got mac & cheese and generalized was baking in.  Will half an hour afterward she started having some itching and redness to her skin.  She states that it progressively worsened.  She googled it and thought that her head would blow up if she did not get treated immediately because she thought she was having anaphylaxis or some other type of allergic reaction.  She presents here for further evaluation.  She denies any lightheadedness, masses, diarrhea, Donnell pain, pallor or breathing issues.  Does not feel any swelling or abnormalities in her mouth or throat.  She is has an erythematous rash on both arms and legs that itches, burns and feels warm.   Rash      Home Medications Prior to Admission medications   Medication Sig Start Date End Date Taking? Authorizing Provider  cyclobenzaprine  (FLEXERIL ) 10 MG tablet Take 1 tablet (10 mg total) by mouth at bedtime. 05/01/23   Farris Hong, PA-C  predniSONE  (STERAPRED UNI-PAK 21 TAB) 10 MG (21) TBPK tablet Take following package directions 05/01/23   Farris Hong, PA-C      Allergies    Latex and Pork-derived products    Review of Systems   Review of Systems  Skin:  Positive for rash.    Physical Exam Updated Vital Signs BP 123/77   Pulse 92   Temp 98.3 F (36.8 C)   Resp 16   LMP 05/08/2023   SpO2 100%  Physical Exam Vitals and nursing note reviewed.  Constitutional:      Appearance: She is well-developed.  HENT:     Head: Normocephalic and atraumatic.  Cardiovascular:     Rate and Rhythm: Normal rate and regular rhythm.  Pulmonary:     Effort: No respiratory distress.     Breath sounds: No stridor.  Abdominal:      General: There is no distension.  Musculoskeletal:     Cervical back: Normal range of motion.  Skin:    Comments: Excoriated area this rash to bilateral forearms and anterior legs.  No hives.  No wheezing on exam.  Vital signs normal.  No oral edema.  Neurological:     Mental Status: She is alert.     ED Results / Procedures / Treatments   Labs (all labs ordered are listed, but only abnormal results are displayed) Labs Reviewed - No data to display  EKG None  Radiology No results found.  Procedures Procedures    Medications Ordered in ED Medications - No data to display  ED Course/ Medical Decision Making/ A&P                                 Medical Decision Making  Not a typical rash consistent with anaphylaxis or allergic reaction.  Suspect is more of a reaction to the nitrates in the port rather than true allergy.  Offered her Benadryl for symptoms and observation however patient felt that she could pick up some Benadryl on the way home and take that seem to help.  Hydrocortisone cream that she already had did not  seem to help much.  No evidence of anaphylaxis on exam.  Discussed signs and symptoms of such and will return here for any of those. otherwise PCP follow up if not improving.   Final Clinical Impression(s) / ED Diagnoses Final diagnoses:  Rash    Rx / DC Orders ED Discharge Orders     None         Zorah Backes, Reymundo Caulk, MD 06/04/23 0710
